# Patient Record
Sex: Female | Born: 1970 | Race: Black or African American | Hispanic: No | Marital: Married | Smoking: Former smoker
Health system: Southern US, Community
[De-identification: ages and names within clinical notes are randomized; demographics above are authoritative.]

## PROBLEM LIST (undated history)

## (undated) DIAGNOSIS — D869 Sarcoidosis, unspecified: Secondary | ICD-10-CM

## (undated) DIAGNOSIS — I639 Cerebral infarction, unspecified: Secondary | ICD-10-CM

## (undated) DIAGNOSIS — I10 Essential (primary) hypertension: Secondary | ICD-10-CM

## (undated) DIAGNOSIS — J449 Chronic obstructive pulmonary disease, unspecified: Secondary | ICD-10-CM

## (undated) DIAGNOSIS — N390 Urinary tract infection, site not specified: Secondary | ICD-10-CM

## (undated) HISTORY — PX: BRONCHOSCOPY: SUR163

---

## 2006-03-25 ENCOUNTER — Ambulatory Visit: Payer: Self-pay | Admitting: Neurology

## 2010-03-30 ENCOUNTER — Emergency Department: Payer: Self-pay | Admitting: Internal Medicine

## 2010-04-29 ENCOUNTER — Ambulatory Visit: Payer: Self-pay | Admitting: Internal Medicine

## 2010-09-12 ENCOUNTER — Ambulatory Visit: Payer: Self-pay

## 2013-04-02 ENCOUNTER — Ambulatory Visit: Payer: Self-pay | Admitting: Family Medicine

## 2013-04-02 LAB — URINALYSIS, COMPLETE
Glucose,UR: NEGATIVE mg/dL (ref 0–75)
RBC,UR: >30 /HPF (ref 0–5)
Specific Gravity: >=1.03 (ref 1.003–1.030)
WBC UR: >30 /HPF (ref 0–5)

## 2013-04-04 LAB — URINE CULTURE

## 2013-10-01 ENCOUNTER — Ambulatory Visit: Payer: Self-pay | Admitting: Family Medicine

## 2013-10-01 LAB — URINALYSIS, COMPLETE
Bilirubin,UR: NEGATIVE
Glucose,UR: NEGATIVE mg/dL (ref 0–75)
Ketone: NEGATIVE
NITRITE: POSITIVE
PH: 7 (ref 4.5–8.0)
Protein: 30
Specific Gravity: 1.015 (ref 1.003–1.030)

## 2013-10-04 LAB — URINE CULTURE

## 2014-03-25 ENCOUNTER — Ambulatory Visit: Payer: Self-pay | Admitting: Physician Assistant

## 2014-03-25 LAB — URINALYSIS, COMPLETE
Bilirubin,UR: NEGATIVE
GLUCOSE, UR: NEGATIVE mg/dL (ref 0–75)
NITRITE: NEGATIVE
Ph: 6 (ref 4.5–8.0)
Specific Gravity: >=1.03 (ref 1.003–1.030)

## 2014-03-26 LAB — URINE CULTURE

## 2015-02-21 ENCOUNTER — Ambulatory Visit
Admission: EM | Admit: 2015-02-21 | Discharge: 2015-02-21 | Disposition: A | Discharge status: Home / Self Care | Payer: BLUE CROSS/BLUE SHIELD | Attending: Family Medicine | Admitting: Family Medicine

## 2015-02-21 DIAGNOSIS — N3090 Cystitis, unspecified without hematuria: Secondary | ICD-10-CM | POA: Diagnosis not present

## 2015-02-21 DIAGNOSIS — B3741 Candidal cystitis and urethritis: Secondary | ICD-10-CM | POA: Diagnosis not present

## 2015-02-21 DIAGNOSIS — N390 Urinary tract infection, site not specified: Secondary | ICD-10-CM | POA: Insufficient documentation

## 2015-02-21 HISTORY — DX: Essential (primary) hypertension: I10

## 2015-02-21 HISTORY — DX: Urinary tract infection, site not specified: N39.0

## 2015-02-21 LAB — URINALYSIS COMPLETE WITH MICROSCOPIC (ARMC ONLY)
Bilirubin Urine: NEGATIVE
Glucose, UA: NEGATIVE mg/dL
Nitrite: NEGATIVE
PH: 5.5 (ref 5.0–8.0)
Protein, ur: NEGATIVE mg/dL
SPECIFIC GRAVITY, URINE: 1.03 (ref 1.005–1.030)

## 2015-02-21 MED ORDER — CIPROFLOXACIN HCL 500 MG PO TABS: 500.0000 mg | ORAL_TABLET | Freq: Two times a day (BID) | ORAL | Status: DC

## 2015-02-21 MED ORDER — FLUCONAZOLE 150 MG PO TABS: ORAL_TABLET | ORAL | Status: AC

## 2015-02-21 NOTE — ED Notes (Signed)
States started last night with urinary frequency and bladder fullness. Leaving for a cruise in the morning. C/o low back pain also

## 2015-02-21 NOTE — ED Provider Notes (Signed)
CSN: [id number]     Arrival date & time 02/21/15  1821 History   First MD Initiated Contact with Patient 02/21/15 1842     Chief Complaint  Patient presents with  . Cystitis   (Consider location/radiation/quality/duration/timing/severity/associated sxs/prior Treatment) HPI 44 yo F with previous hx of UTIs now felling full, noting frequency and worried because leaving for a cruise in the AM. Has had repeated UTIs . Followed by Urology -seen 6 months ago and doing well. Had some RBCs in past-monitored . Uses Cipro successfully in past. No back pain ,fever, nausea or vomting  Past Medical History  Diagnosis Date  . UTI (lower urinary tract infection)   . Hypertension    Past Surgical History  Procedure Laterality Date  . Cesarean section      2005   Family History  Problem Relation Age of Onset  . Heart failure Mother   . Cancer Father    History  Substance Use Topics  . Smoking status: Former Games developer  . Smokeless tobacco: Not on file  . Alcohol Use: Yes     Comment: socially   OB History    No data available     Review of Systems  Constitutional -afebrile Eyes-denies visual changes ENT- normal voice,denies sore throat CV-denies chest pain Resp-denies SOB GI- negative for nausea,vomiting, diarrhea GU- negative for dysuria-feels low abdominal pressure MSK- negative for back pain, ambulatory- no CVAT Skin- denies acute changes Neuro- negative headache,focal weakness or numbness   Allergies  Review of patient's allergies indicates no known allergies. Review of 10 systems negative for acute change except as referenced in HPI Home Medications   Prior to Admission medications   Medication Sig Start Date End Date Taking? Authorizing Provider  bisoprolol-hydrochlorothiazide (ZIAC) 2.5-6.25 MG per tablet Take 1 tablet by mouth daily.   Yes Historical Provider, MD  ciprofloxacin (CIPRO) 500 MG tablet Take 1 tablet (500 mg total) by mouth every 12 (twelve) hours. 02/21/15    Rae Halsted, PA-C  fluconazole (DIFLUCAN) 150 MG tablet Take one tablet now, and repeat in a week if needed 02/21/15   Rae Halsted, PA-C   BP 134/82 mmHg  Pulse 78  Temp(Src) 98.8 F (37.1 C) (Tympanic)  Resp 16  Ht  (1.753 m)  Wt 227 lb (102.967 kg)  BMI 33.51 kg/m2  SpO2 100%  LMP 01/18/2015 (Approximate) Physical Exam Constitutional -alert and oriented,well appearing and in no acute distress Head-atraumatic Eyes-  EOMI ,conjugate gaze Nose- no congestion  Mouth/throat- mucous membranes moist , Neck- supple without glandular enlargement CV- regular rate, grossly normal heart sounds,  Resp-no distress, normal respiratory effort,clear to auscultation bilaterally Back - no CVAT GI- soft,non-tender,no distention GU-/ not examined Musculo-, ambulatory Neuro- normal speech and language,  Skin-warm,dry ,intact; no rash noted Psych-mood and affect grossly normal; speech and behavior grossly normal ED Course  Procedures (including critical care time) Labs Review Labs Reviewed  URINALYSIS COMPLETEWITH MICROSCOPIC ([facility] ONLY) - Abnormal; Notable for the following:    APPearance HAZY (*)    Ketones, ur TRACE (*)    Hgb urine dipstick 2+ (*)    Leukocytes, UA TRACE (*)    Bacteria, UA MANY (*)    Squamous Epithelial / LPF 6-30 (*)    All other components within normal limits  URINE CULTURE    Imaging Review No results found.   MDM   1. Lower urinary tract infection   2. Yeast cystitis    Diflucan 150 mg now and repeat  in a week. Ciprofloxin 500 mg BID x 5 days- 1 refill. Call on return from trip to check culture results Rx would not populate  Plan: 1. Test/ results and diagnosis reviewed with patient 2. rx as per orders; risks, benefits, potential side effects reviewed with patient 3. Recommend supportive treatment with increased fluids by mouth, cranberry.blueberry 4. F/u prn if symptoms worsen or don't improve  Questions fielded, expectations and  recommendations reviewed. Patient expresses understanding. Will return to [facility] with questions, concern or exacerbation.   Rae Halsted, PA-C 02/21/15 2019

## 2015-02-23 LAB — URINE CULTURE

## 2015-05-31 ENCOUNTER — Encounter: Payer: Self-pay | Admitting: Emergency Medicine

## 2015-05-31 ENCOUNTER — Inpatient Hospital Stay
Admission: EM | Admit: 2015-05-31 | Discharge: 2015-06-01 | DRG: 060 | Disposition: A | Discharge status: Home / Self Care | Payer: BLUE CROSS/BLUE SHIELD | Attending: Internal Medicine | Admitting: Internal Medicine

## 2015-05-31 ENCOUNTER — Emergency Department: Payer: BLUE CROSS/BLUE SHIELD

## 2015-05-31 DIAGNOSIS — Z8673 Personal history of transient ischemic attack (TIA), and cerebral infarction without residual deficits: Secondary | ICD-10-CM | POA: Diagnosis not present

## 2015-05-31 DIAGNOSIS — Z79899 Other long term (current) drug therapy: Secondary | ICD-10-CM | POA: Diagnosis not present

## 2015-05-31 DIAGNOSIS — Z87891 Personal history of nicotine dependence: Secondary | ICD-10-CM | POA: Diagnosis not present

## 2015-05-31 DIAGNOSIS — I10 Essential (primary) hypertension: Secondary | ICD-10-CM | POA: Diagnosis present

## 2015-05-31 DIAGNOSIS — J449 Chronic obstructive pulmonary disease, unspecified: Secondary | ICD-10-CM | POA: Diagnosis present

## 2015-05-31 DIAGNOSIS — R51 Headache: Secondary | ICD-10-CM | POA: Diagnosis present

## 2015-05-31 DIAGNOSIS — R300 Dysuria: Secondary | ICD-10-CM | POA: Diagnosis present

## 2015-05-31 DIAGNOSIS — D869 Sarcoidosis, unspecified: Secondary | ICD-10-CM | POA: Diagnosis present

## 2015-05-31 DIAGNOSIS — G379 Demyelinating disease of central nervous system, unspecified: Principal | ICD-10-CM | POA: Diagnosis present

## 2015-05-31 DIAGNOSIS — I639 Cerebral infarction, unspecified: Secondary | ICD-10-CM | POA: Diagnosis present

## 2015-05-31 DIAGNOSIS — R208 Other disturbances of skin sensation: Secondary | ICD-10-CM

## 2015-05-31 DIAGNOSIS — R61 Generalized hyperhidrosis: Secondary | ICD-10-CM

## 2015-05-31 DIAGNOSIS — R200 Anesthesia of skin: Secondary | ICD-10-CM

## 2015-05-31 DIAGNOSIS — G459 Transient cerebral ischemic attack, unspecified: Secondary | ICD-10-CM

## 2015-05-31 DIAGNOSIS — R202 Paresthesia of skin: Secondary | ICD-10-CM

## 2015-05-31 HISTORY — DX: Chronic obstructive pulmonary disease, unspecified: J44.9

## 2015-05-31 HISTORY — DX: Cerebral infarction, unspecified: I63.9

## 2015-05-31 HISTORY — DX: Sarcoidosis, unspecified: D86.9

## 2015-05-31 LAB — URINALYSIS COMPLETE WITH MICROSCOPIC (ARMC ONLY)
Bilirubin Urine: NEGATIVE
Glucose, UA: NEGATIVE mg/dL
KETONES UR: NEGATIVE mg/dL
Nitrite: NEGATIVE
Protein, ur: NEGATIVE mg/dL
Specific Gravity, Urine: 1.015 (ref 1.005–1.030)
pH: 7 (ref 5.0–8.0)

## 2015-05-31 LAB — COMPREHENSIVE METABOLIC PANEL
ALBUMIN: 4.1 g/dL (ref 3.5–5.0)
ALT: 17 U/L (ref 14–54)
AST: 26 U/L (ref 15–41)
Alkaline Phosphatase: 71 U/L (ref 38–126)
Anion gap: 7 (ref 5–15)
BUN: 17 mg/dL (ref 6–20)
CO2: 28 mmol/L (ref 22–32)
Calcium: 9.1 mg/dL (ref 8.9–10.3)
Chloride: 103 mmol/L (ref 101–111)
Creatinine, Ser: 0.89 mg/dL (ref 0.44–1.00)
GFR calc Af Amer: >60 mL/min (ref >60)
GFR calc non Af Amer: >60 mL/min (ref >60)
Glucose, Bld: 90 mg/dL (ref 65–99)
Potassium: 3.5 mmol/L (ref 3.5–5.1)
Sodium: 138 mmol/L (ref 135–145)
Total Bilirubin: 0.7 mg/dL (ref 0.3–1.2)
Total Protein: 7.9 g/dL (ref 6.5–8.1)

## 2015-05-31 LAB — CBC
HCT: 38.5 % (ref 35.0–47.0)
Hemoglobin: 13 g/dL (ref 12.0–16.0)
MCH: 31.7 pg (ref 26.0–34.0)
MCHC: 33.7 g/dL (ref 32.0–36.0)
MCV: 94.1 fL (ref 80.0–100.0)
PLATELETS: 289 10*3/uL (ref 150–440)
RBC: 4.09 MIL/uL (ref 3.80–5.20)
RDW: 13.3 % (ref 11.5–14.5)
WBC: 7.9 10*3/uL (ref 3.6–11.0)

## 2015-05-31 LAB — POCT PREGNANCY, URINE: Preg Test, Ur: NEGATIVE

## 2015-05-31 LAB — TROPONIN I

## 2015-05-31 MED ORDER — ASPIRIN EC 325 MG PO TBEC
325.0000 mg | DELAYED_RELEASE_TABLET | Freq: Every day | ORAL | Status: DC
Start: 2015-06-01 — End: 2015-06-01
  Administered 2015-06-01 (×2): 325 mg via ORAL
  Filled 2015-05-31 (×2): qty 1

## 2015-05-31 NOTE — H&P (Addendum)
[facility] - [place] at [facility]   PATIENT NAME: Grace Olson    MR#:  [id number]  DATE OF BIRTH:  Oct 10, 1970  DATE OF ADMISSION:  05/31/2015  PRIMARY CARE PHYSICIAN: Berneice Gandy, MD   REQUESTING/REFERRING PHYSICIAN: Sharma Covert, MD  CHIEF COMPLAINT:   Chief Complaint  Patient presents with  . Excessive Sweating  . Numbness  . Shortness of Breath    HISTORY OF PRESENT ILLNESS:  Grace Olson  is a 44 y.o. female who presents with numbness of the left upper and lower extremities. Patient states that symptoms started today, and have persisted. She states that she's had episodes in the past where she had some amount of numbness for brief periods of time, but those resolved quickly. Symptoms today have not resolved. She also states that she had some shortness of breath earlier on in the day with an episode of diaphoresis. She has a history of sarcoidosis, which she states is well controlled. She has had an MRI proven stroke in the past per her report. Her symptoms with her prior stroke were focal lower extremity weakness, in conjunction with sensory deficit in the form of numbness. Her symptoms completely resolved after that stroke. Lab and imaging workup in the ED is initially benign. Patient does state that she has history of frequent UTIs, for which she sees urology. She states that she feels like she is developing UTI over the last couple of days. UA in the ED is not strongly suggestive for UTI, but does have a small amount of blood. Hospitalists were called for admission for workup of her numbness, with suspicion of TIA/CVA.  PAST MEDICAL HISTORY:   Past Medical History  Diagnosis Date  . UTI (lower urinary tract infection)   . Hypertension   . Sarcoid   . COPD (chronic obstructive pulmonary disease)   . Stroke     PAST SURGICAL HISTORY:   Past Surgical History  Procedure Laterality Date  . Cesarean section      2005  . Bronchoscopy      SOCIAL  HISTORY:   Social History  Substance Use Topics  . Smoking status: Former Games developer  . Smokeless tobacco: Never Used  . Alcohol Use: Yes     Comment: socially    FAMILY HISTORY:   Family History  Problem Relation Age of Onset  . Heart failure Mother   . Lung cancer Father   . Hypertension Mother   . Stroke Mother   . Hyperlipidemia Mother   . CAD Mother   . Hypertension Father   . Diabetes Sister   . CAD Maternal Grandmother   . Hypertension Maternal Grandmother   . Prostate cancer Maternal Grandfather   . Colon cancer Maternal Uncle     DRUG ALLERGIES:  No Known Allergies  MEDICATIONS AT HOME:   Prior to Admission medications   Medication Sig Start Date End Date Taking? Authorizing Provider  albuterol (PROVENTIL HFA;VENTOLIN HFA) 108 (90 BASE) MCG/ACT inhaler Inhale 2 puffs into the lungs every 6 (six) hours as needed for wheezing or shortness of breath.   Yes Historical Provider, MD  bisoprolol-hydrochlorothiazide (ZIAC) 2.5-6.25 MG per tablet Take 1 tablet by mouth daily.   Yes Historical Provider, MD  fluconazole (DIFLUCAN) 150 MG tablet Take one tablet now, and repeat in a week if needed Patient taking differently: Take 150 mg by mouth See admin instructions. Pt is to take one dose now and repeat in one week if necessary. 02/21/15  Yes Jacki Cones  Shonna Chock, PA-C  naproxen sodium (ANAPROX) 220 MG tablet Take 440 mg by mouth 2 (two) times daily as needed (for pain/headache).   Yes Historical Provider, MD  doxycycline (VIBRA-TABS) 100 MG tablet Take 100 mg by mouth 2 (two) times daily. 05/31/15 06/07/15  Historical Provider, MD    REVIEW OF SYSTEMS:  Review of Systems  Constitutional: Negative for fever, chills, weight loss and malaise/fatigue.  HENT: Negative for ear pain, hearing loss and tinnitus.   Eyes: Negative for blurred vision, double vision, pain and redness.  Respiratory: Positive for shortness of breath. Negative for cough and hemoptysis.   Cardiovascular: Negative for  chest pain, palpitations, orthopnea and leg swelling.  Gastrointestinal: Negative for nausea, vomiting, abdominal pain, diarrhea and constipation.  Genitourinary: Positive for dysuria. Negative for frequency and hematuria.  Musculoskeletal: Negative for back pain, joint pain and neck pain.  Skin:       No acne, rash, or lesions  Neurological: Positive for sensory change. Negative for dizziness, tremors, focal weakness and weakness.  Endo/Heme/Allergies: Negative for polydipsia. Does not bruise/bleed easily.  Psychiatric/Behavioral: Negative for depression. The patient is not nervous/anxious and does not have insomnia.      VITAL SIGNS:   Filed Vitals:   05/31/15 2010 05/31/15 2108 05/31/15 2225  BP: 146/85 116/71 139/81  Pulse: 65 59 64  Temp: 98.5 F (36.9 C)    TempSrc: Oral    Resp: Height:  (1.753 m)    Weight: 104.327 kg (230 lb)    SpO2: 100%  100%   Wt Readings from Last 3 Encounters:  05/31/15 104.327 kg (230 lb)  02/21/15 102.967 kg (227 lb)    PHYSICAL EXAMINATION:  Physical Exam  Vitals reviewed. Constitutional: She is oriented to person, place, and time. She appears well-developed and well-nourished. No distress.  HENT:  Head: Normocephalic and atraumatic.  Mouth/Throat: Oropharynx is clear and moist.  Eyes: Conjunctivae and EOM are normal. Pupils are equal, round, and reactive to light. No scleral icterus.  Neck: Normal range of motion. Neck supple. No JVD present. No thyromegaly present.  Cardiovascular: Normal rate, regular rhythm and intact distal pulses.  Exam reveals no gallop and no friction rub.   No murmur heard. Respiratory: Effort normal and breath sounds normal. No respiratory distress. She has no wheezes. She has no rales.  GI: Soft. Bowel sounds are normal. She exhibits no distension. There is no tenderness.  Musculoskeletal: Normal range of motion. She exhibits no edema.  No arthritis, no gout  Lymphadenopathy:    She has no  cervical adenopathy.  Neurological: She is alert and oriented to person, place, and time. No cranial nerve deficit.  Neurologic: Cranial nerves II-XII intact, Sensation intact to light touch/pinprick - the patient complains of tingling feeling in her distal left hand and distal left foot, 5/5 strength in all extremities, no dysarthria, no aphasia, no dysphagia, memory intact, finger to nose testing showed no abnormality, no pronator drift, DTR intact, Babinski sign not present.   Skin: Skin is warm and dry. No rash noted. No erythema.  Psychiatric: She has a normal mood and affect. Her behavior is normal. Judgment and thought content normal.    LABORATORY PANEL:   CBC  Recent Labs Lab 05/31/15 2024  WBC 7.9  HGB 13.0  HCT 38.5  PLT 289   ------------------------------------------------------------------------------------------------------------------  Chemistries   Recent Labs Lab 05/31/15 2024  NA 138  K 3.5  CL 103  CO2 28  GLUCOSE 90  BUN 17  CREATININE 0.89  CALCIUM 9.1  AST 26  ALT 17  ALKPHOS 71  BILITOT 0.7   ------------------------------------------------------------------------------------------------------------------  Cardiac Enzymes  Recent Labs Lab 05/31/15 2024  TROPONINI <0.03   ------------------------------------------------------------------------------------------------------------------  RADIOLOGY:  Dg Chest 2 View  05/31/2015   CLINICAL DATA:  Numbness. Left arm and leg tingling for 9 hours. Shortness of breath.  EXAM: CHEST  2 VIEW  COMPARISON:  09/12/2010  FINDINGS: The cardiomediastinal contours are normal. Minimal linear atelectasis or scarring in the left upper lung zone. Pulmonary vasculature is normal. No consolidation, pleural effusion, or pneumothorax. No acute osseous abnormalities are seen.  IMPRESSION: No acute pulmonary process.   Electronically Signed   By: Rubye Oaks M.D.   On: 05/31/2015 21:40   Ct Head Wo  Contrast  05/31/2015   CLINICAL DATA:  Left arm numbness and tingling  EXAM: CT HEAD WITHOUT CONTRAST  TECHNIQUE: Contiguous axial images were obtained from the base of the skull through the vertex without intravenous contrast.  COMPARISON:  None.  FINDINGS: No skull fracture is noted. Paranasal sinuses and mastoid air cells are unremarkable. No intracranial hemorrhage, mass effect or midline shift. No acute cortical infarction. No mass lesion is noted on this unenhanced scan. No hydrocephalus. No intra or extra-axial fluid collection.  IMPRESSION: No acute intracranial abnormality.   Electronically Signed   By: Natasha Mead M.D.   On: 05/31/2015 20:46    EKG:   Orders placed or performed during the hospital encounter of 05/31/15  . ED EKG within 10 minutes  . ED EKG within 10 minutes  . EKG 12-Lead  . EKG 12-Lead    IMPRESSION AND PLAN:  Principal Problem:   CVA (cerebral infarction) - admitting the patient on telemetry, order MRI and MRA of her brain as well as MRA of her neck. We'll get a fasting lipid panel, hemoglobin A1c, trend her troponins, give her full strength aspirin, get echocardiogram, and order neurology consult. We will also check her vitamin B12 and vitamin D levels. Active Problems:   HTN (hypertension) - hold home antihypertensives for first 24 hours to allow for permissive hypertension BP goal of less than 220/110.   H/O: stroke - patient states this is MRI proven in the past with some overlap her current symptoms when she had her initial stroke. However, for unknown reasons she was not on daily aspirin.   Frequent UTI  - patient has a history of frequent UTIs, UA not strongly suspicious for recurrent UTI, the patient is having clinical symptoms. We will send a urine culture and follow up and initiate antibiotics if she has any positive growth.    COPD (chronic obstructive pulmonary disease) - continue home inhaler when necessary   Sarcoid - patient states this is currently  under control, chest x-ray was clear and it seems less likely this is contributing to her problem, we'll monitor this for now. Calcium is normal, we'll check a vitamin D.  All the records are reviewed and case discussed with ED provider. Management plans discussed with the patient and/or family.  DVT PROPHYLAXIS: SubQ lovenox  ADMISSION STATUS: Inpatient  CODE STATUS: Full  TOTAL TIME TAKING CARE OF THIS PATIENT: 45 minutes.    WILLIS, DAVID FIELDING 05/31/2015, 11:41 PM  [facility]  Office  [phone]  CC: Primary care physician; Berneice Gandy, MD

## 2015-05-31 NOTE — ED Notes (Signed)
MD at bedside. 

## 2015-05-31 NOTE — ED Notes (Signed)
Pt states began to have left arm tingling and left leg tingling at noon. Pt states began to experience sweating and shob at same time. Pt denies nausea, vomiting, denies chest pain. Pt states now has posterior headaceh.

## 2015-05-31 NOTE — ED Provider Notes (Signed)
[facility] Emergency Department Provider Note  ____________________________________________  Time seen: Approximately 9:23 PM  I have reviewed the triage vital signs and the nursing notes.   HISTORY  Chief Complaint Excessive Sweating; Numbness; and Shortness of Breath    HPI Grace Olson is a 44 y.o. female with a history of sarcoidosis, HTN, presenting with diaphoresis, left upper extremity and left foot numbness. Patient discovered that she was sitting at her desk when she developed "hot flashes"associated with left upper extremity numbness and tingling and left foot numbness and tingling. She also had a mild headache in the crown of her head. She denies any visual changes, speech changes, difficulty walking, weakness. She did not have any chest pain, shortness of breath, nausea or vomiting. She has not had any recent trauma, changes in her medications, or other illness.   Past Medical History  Diagnosis Date  . UTI (lower urinary tract infection)   . Hypertension   . Sarcoid   . HTN (hypertension)   . COPD (chronic obstructive pulmonary disease)   . Stroke    sarcoid  There are no active problems to display for this patient.   Past Surgical History  Procedure Laterality Date  . Cesarean section      2005  . Bronchoscopy      Current Outpatient Rx  Name  Route  Sig  Dispense  Refill  . albuterol (PROVENTIL HFA;VENTOLIN HFA) 108 (90 BASE) MCG/ACT inhaler   Inhalation   Inhale 2 puffs into the lungs every 6 (six) hours as needed for wheezing or shortness of breath.         . bisoprolol-hydrochlorothiazide (ZIAC) 2.5-6.25 MG per tablet   Oral   Take 1 tablet by mouth daily.         . fluconazole (DIFLUCAN) 150 MG tablet      Take one tablet now, and repeat in a week if needed Patient taking differently: Take 150 mg by mouth See admin instructions. Pt is to take one dose now and repeat in one week if necessary.   2 tablet   0   .  naproxen sodium (ANAPROX) 220 MG tablet   Oral   Take 440 mg by mouth 2 (two) times daily as needed (for pain/headache).         . ciprofloxacin (CIPRO) 500 MG tablet   Oral   Take 1 tablet (500 mg total) by mouth every 12 (twelve) hours. Patient not taking: Reported on 05/31/2015   10 tablet   1   . doxycycline (VIBRA-TABS) 100 MG tablet   Oral   Take 100 mg by mouth 2 (two) times daily.           Allergies Review of patient's allergies indicates no known allergies.  Family History  Problem Relation Age of Onset  . Heart failure Mother   . Lung cancer Father   . Hypertension Mother   . Stroke Mother   . Hyperlipidemia Mother   . CAD Mother   . Hypertension Father   . Diabetes Sister   . CAD Maternal Grandmother   . Hypertension Maternal Grandmother   . Prostate cancer Maternal Grandfather   . Colon cancer Maternal Uncle    Mother and grandmother with history of CHF, MI, and stroke.  Social History Social History  Substance Use Topics  . Smoking status: Former Games developer  . Smokeless tobacco: Never Used  . Alcohol Use: Yes     Comment: socially    Review  of Systems Constitutional: No fever/chills Eyes: No visual changes. ENT: No sore throat. Cardiovascular: Denies chest pain, palpitations. Respiratory: Denies shortness of breath.  No cough. Gastrointestinal: No abdominal pain.  No nausea, no vomiting.  No diarrhea.  No constipation. Genitourinary: Negative for dysuria. Musculoskeletal: Negative for back pain. Skin: Negative for rash. Neurological: Negative for headaches, positive for numbness and tingling. Positive for headache  10-point ROS otherwise negative.  ____________________________________________   PHYSICAL EXAM:  VITAL SIGNS: ED Triage Vitals  Enc Vitals Group     BP 05/31/15 2010 146/85 mmHg     Pulse Rate 05/31/15 2010 65     Resp 05/31/15 2010 20     Temp 05/31/15 2010 98.5 F (36.9 C)     Temp Source 05/31/15 2010 Oral     SpO2  05/31/15 2010 100 %     Weight 05/31/15 2010 230 lb (104.327 kg)     Height 05/31/15 2010  (1.753 m)     Head Cir --      Peak Flow --      Pain Score --      Pain Loc --      Pain Edu? --      Excl. in GC? --     Constitutional: Alert and oriented. Well appearing and in no acute distress. Answer question appropriately. Eyes: Conjunctivae are normal.  EOMI. Head: Atraumatic. Nose: No congestion/rhinnorhea. Mouth/Throat: Mucous membranes are moist.  Neck: No stridor.  Supple.   Cardiovascular: Normal rate, regular rhythm. No murmurs, rubs or gallops.  Respiratory: Normal respiratory effort.  No retractions. Lungs CTAB.  No wheezes, rales or ronchi. Gastrointestinal: Soft and nontender. No distention. No peritoneal signs. Musculoskeletal: No LE edema.  Neurologic:  Alert and oriented 3. Speech is clear. Face and smile are symmetric. Extraocular movements are intact. PERRLA. No pronator drift. 5 out of 5 bilateral grip, biceps, triceps, hip flexors, but flexion and extension. Normal sensation to light touch on the face. Decreased sensation to light touch in the left upper extremity and left lower extremity. Normal finger-nose-finger.  Skin:  Skin is warm, dry and intact. No rash noted. Psychiatric: Mood and affect are normal. Speech and behavior are normal.  Normal judgement.  ____________________________________________   LABS (all labs ordered are listed, but only abnormal results are displayed)  Labs Reviewed  URINALYSIS COMPLETEWITH MICROSCOPIC ([facility] ONLY) - Abnormal; Notable for the following:    Color, Urine YELLOW (*)    APPearance CLEAR (*)    Hgb urine dipstick 2+ (*)    Leukocytes, UA TRACE (*)    Bacteria, UA MANY (*)    Squamous Epithelial / LPF 0-5 (*)    All other components within normal limits  CBC  TROPONIN I  COMPREHENSIVE METABOLIC PANEL  POCT PREGNANCY, URINE   ____________________________________________  EKG  ED ECG REPORT I, Rockne Menghini, the attending physician, personally viewed and interpreted this ECG.   Date: 05/31/2015  Rhythm: Normal sinus rhythm  Axis: Normal axis   Intervals:none  ST&T Change: No ST-T changes.  ____________________________________________  RADIOLOGY  Dg Chest 2 View  05/31/2015   CLINICAL DATA:  Numbness. Left arm and leg tingling for 9 hours. Shortness of breath.  EXAM: CHEST  2 VIEW  COMPARISON:  09/12/2010  FINDINGS: The cardiomediastinal contours are normal. Minimal linear atelectasis or scarring in the left upper lung zone. Pulmonary vasculature is normal. No consolidation, pleural effusion, or pneumothorax. No acute osseous abnormalities are seen.  IMPRESSION: No acute pulmonary process.  Electronically Signed   By: Rubye Oaks M.D.   On: 05/31/2015 21:40   Ct Head Wo Contrast  05/31/2015   CLINICAL DATA:  Left arm numbness and tingling  EXAM: CT HEAD WITHOUT CONTRAST  TECHNIQUE: Contiguous axial images were obtained from the base of the skull through the vertex without intravenous contrast.  COMPARISON:  None.  FINDINGS: No skull fracture is noted. Paranasal sinuses and mastoid air cells are unremarkable. No intracranial hemorrhage, mass effect or midline shift. No acute cortical infarction. No mass lesion is noted on this unenhanced scan. No hydrocephalus. No intra or extra-axial fluid collection.  IMPRESSION: No acute intracranial abnormality.   Electronically Signed   By: Natasha Mead M.D.   On: 05/31/2015 20:46    ____________________________________________   PROCEDURES  Procedure(s) performed: None  Critical Care performed: No ____________________________________________   INITIAL IMPRESSION / ASSESSMENT AND PLAN / ED COURSE  Pertinent labs & imaging results that were available during my care of the patient were reviewed by me and considered in my medical decision making (see chart for details).  44 y.o. female with a history of sarcoidosis and hypertension  presenting with acute onset of diaphoresis left upper extremity numbness and left foot numbness. She continues to have these sensory deficits. She is young with few risk factors, but will need evaluation for acute stroke. I would also be concerned about acute flare of sarcoid, consider new diagnosis of multiple sclerosis as the patient states that she has had episodes of sensory deficits in the distant past. Plan for admission.  ____________________________________________  FINAL CLINICAL IMPRESSION(S) / ED DIAGNOSES  Final diagnoses:  Diaphoresis  Numbness and tingling in left arm  Numbness of left foot      NEW MEDICATIONS STARTED DURING THIS VISIT:  New Prescriptions   No medications on file     Rockne Menghini, MD 05/31/15 2312

## 2015-06-01 ENCOUNTER — Inpatient Hospital Stay
Admit: 2015-06-01 | Discharge: 2015-06-01 | Disposition: A | Discharge status: Home / Self Care | Payer: BLUE CROSS/BLUE SHIELD | Attending: Internal Medicine | Admitting: Internal Medicine

## 2015-06-01 ENCOUNTER — Inpatient Hospital Stay: Payer: BLUE CROSS/BLUE SHIELD

## 2015-06-01 LAB — CBC
HCT: 35.6 % (ref 35.0–47.0)
HEMOGLOBIN: 12.3 g/dL (ref 12.0–16.0)
MCH: 32.3 pg (ref 26.0–34.0)
MCHC: 34.5 g/dL (ref 32.0–36.0)
MCV: 93.5 fL (ref 80.0–100.0)
PLATELETS: 235 10*3/uL (ref 150–440)
RBC: 3.81 MIL/uL (ref 3.80–5.20)
RDW: 13.4 % (ref 11.5–14.5)
WBC: 8.8 10*3/uL (ref 3.6–11.0)

## 2015-06-01 LAB — LIPID PANEL
CHOL/HDL RATIO: 2.8 ratio
CHOLESTEROL: 152 mg/dL (ref 0–200)
HDL: 55 mg/dL (ref >40)
LDL Cholesterol: 92 mg/dL (ref 0–99)
Triglycerides: 26 mg/dL (ref <150)
VLDL: 5 mg/dL (ref 0–40)

## 2015-06-01 LAB — BASIC METABOLIC PANEL
ANION GAP: 6 (ref 5–15)
BUN: 13 mg/dL (ref 6–20)
CHLORIDE: 105 mmol/L (ref 101–111)
CO2: 27 mmol/L (ref 22–32)
CREATININE: 0.72 mg/dL (ref 0.44–1.00)
Calcium: 8.5 mg/dL — ABNORMAL LOW (ref 8.9–10.3)
GFR calc non Af Amer: >60 mL/min (ref >60)
Glucose, Bld: 91 mg/dL (ref 65–99)
POTASSIUM: 3.5 mmol/L (ref 3.5–5.1)
SODIUM: 138 mmol/L (ref 135–145)

## 2015-06-01 LAB — TROPONIN I: Troponin I: <0.03 ng/mL (ref <0.031)

## 2015-06-01 MED ORDER — STROKE: EARLY STAGES OF RECOVERY BOOK
Freq: Once | Status: AC
  Administered 2015-06-01: 02:00:00

## 2015-06-01 MED ORDER — ONDANSETRON HCL 4 MG PO TABS: 4.0000 mg | ORAL_TABLET | Freq: Four times a day (QID) | ORAL | Status: DC | PRN

## 2015-06-01 MED ORDER — SODIUM CHLORIDE 0.9 % IJ SOLN: 3.0000 mL | Freq: Two times a day (BID) | INTRAMUSCULAR | Status: DC

## 2015-06-01 MED ORDER — GADOBENATE DIMEGLUMINE 529 MG/ML IV SOLN
20.0000 mL | Freq: Once | INTRAVENOUS | Status: AC | PRN
  Administered 2015-06-01: 11:00:00 20 mL via INTRAVENOUS

## 2015-06-01 MED ORDER — ALBUTEROL SULFATE (2.5 MG/3ML) 0.083% IN NEBU: 3.0000 mL | INHALATION_SOLUTION | Freq: Four times a day (QID) | RESPIRATORY_TRACT | Status: DC | PRN

## 2015-06-01 MED ORDER — ENOXAPARIN SODIUM 40 MG/0.4ML ~~LOC~~ SOLN
40.0000 mg | Freq: Every day | SUBCUTANEOUS | Status: DC
  Administered 2015-06-01: 40 mg via SUBCUTANEOUS
  Filled 2015-06-01: qty 0.4

## 2015-06-01 MED ORDER — ASPIRIN 81 MG PO TABS: 81.0000 mg | ORAL_TABLET | Freq: Every day | ORAL | Status: AC

## 2015-06-01 MED ORDER — SODIUM CHLORIDE 0.9 % IV SOLN
INTRAVENOUS | Status: AC
  Administered 2015-06-01: 02:00:00 via INTRAVENOUS

## 2015-06-01 MED ORDER — ACETAMINOPHEN 650 MG RE SUPP: 650.0000 mg | Freq: Four times a day (QID) | RECTAL | Status: DC | PRN

## 2015-06-01 MED ORDER — ONDANSETRON HCL 4 MG/2ML IJ SOLN
4.0000 mg | Freq: Four times a day (QID) | INTRAMUSCULAR | Status: DC | PRN
Start: 2015-06-01 — End: 2015-06-01

## 2015-06-01 MED ORDER — ACETAMINOPHEN 325 MG PO TABS: 650.0000 mg | ORAL_TABLET | Freq: Four times a day (QID) | ORAL | Status: DC | PRN

## 2015-06-01 NOTE — Discharge Instructions (Signed)
Please call Monday to make a follow up appointment with Dr. Ether Griffins.  She will need to order an MRI of the brain with contrast.   DIET:  Regular diet  DISCHARGE CONDITION:  Stable  ACTIVITY:  Activity as tolerated  OXYGEN:  Home Oxygen: No.   Oxygen Delivery: room air  DISCHARGE LOCATION:  home   If you experience worsening of your admission symptoms, develop shortness of breath, life threatening emergency, suicidal or homicidal thoughts you must seek medical attention immediately by calling 911 or calling your MD immediately  if symptoms less severe.  You Must read complete instructions/literature along with all the possible adverse reactions/side effects for all the Medicines you take and that have been prescribed to you. Take any new Medicines after you have completely understood and accpet all the possible adverse reactions/side effects.   Please note  You were cared for by a hospitalist during your hospital stay. If you have any questions about your discharge medications or the care you received while you were in the hospital after you are discharged, you can call the unit and asked to speak with the hospitalist on call if the hospitalist that took care of you is not available. Once you are discharged, your primary care physician will handle any further medical issues. Please note that NO REFILLS for any discharge medications will be authorized once you are discharged, as it is imperative that you return to your primary care physician (or establish a relationship with a primary care physician if you do not have one) for your aftercare needs so that they can reassess your need for medications and monitor your lab values.

## 2015-06-01 NOTE — Progress Notes (Signed)
Pt being discharged home at this time, discharge reviewed with pt, states understanding, pt with no noted complaints at discharge

## 2015-06-01 NOTE — Evaluation (Signed)
Physical Therapy Evaluation Patient Details Name: DENIESHA STENGLEIN MRN: [id number] DOB: [birth year] Today's Date: 06/01/2015   History of Present Illness  Pt with some L U&LEs numbness  Clinical Impression  Pt with no weakness or functional limitations. She is still having some L finger numbness along with some L heel and dorsal foot numbness but again there is no functional limitations. No visual, coordination or other CVA like symptoms.  Pt does well and does not need further PT intervention.     Follow Up Recommendations No PT follow up    Equipment Recommendations       Recommendations for Other Services       Precautions / Restrictions Precautions Precautions: None Restrictions Weight Bearing Restrictions: No      Mobility  Bed Mobility Overal bed mobility: Independent                Transfers Overall transfer level: Independent                  Ambulation/Gait Ambulation/Gait assistance: Independent Ambulation Distance (Feet): 250 Feet Assistive device: None       General Gait Details: Pt walks with confidence and good speed.  She has no LOBs and reports being at her baseline.    Stairs Stairs: Yes Stairs assistance: Independent Stair Management: No rails Number of Stairs: 12 General stair comments: negotiates up/down steps with good confidence and no safety issues  Wheelchair Mobility    Modified Rankin (Stroke Patients Only)       Balance Overall balance assessment: Independent                                           Pertinent Vitals/Pain Pain Assessment: No/denies pain    Home Living Family/patient expects to be discharged to:: Private residence Living Arrangements: Spouse/significant other;Children   Type of Home: House Home Access: Stairs to enter     Home Layout: Two level        Prior Function Level of Independence: Independent (goes to the gym weekly)         Comments: pt completely  independent and has no functional limitations     Hand Dominance        Extremity/Trunk Assessment   Upper Extremity Assessment: Overall WFL for tasks assessed (equal bilaterally)           Lower Extremity Assessment: Overall WFL for tasks assessed (equal bilaterally)         Communication   Communication: No difficulties  Cognition Arousal/Alertness: Awake/alert Behavior During Therapy: WFL for tasks assessed/performed Overall Cognitive Status: Within Functional Limits for tasks assessed                      General Comments      Exercises        Assessment/Plan    PT Assessment Patent does not need any further PT services  PT Diagnosis Difficulty walking   PT Problem List    PT Treatment Interventions     PT Goals (Current goals can be found in the Care Plan section) Acute Rehab PT Goals Patient Stated Goal: I just wanted to make sure everything was okay    Frequency     Barriers to discharge        Co-evaluation               End  of Session Equipment Utilized During Treatment: Gait belt Activity Tolerance: Patient tolerated treatment well Patient left: in bed           Time: 0756-0810 PT Time Calculation (min) (ACUTE ONLY): 14 min   Charges:   PT Evaluation $Initial PT Evaluation Tier I: 1 Procedure     PT G Codes:       Loran Senters, PT, DPT [phone]  Malachi Pro 06/01/2015, 10:17 AM

## 2015-06-01 NOTE — Progress Notes (Signed)
SLP Note  Patient Details Name: Grace Olson MRN: [id number] DOB: March 12, 1971   Received order for speech therapy evaluation. Reviewed chart and spoke w/nsg and pt. Pt was admitted for UE and LE tingling. Pt is currently on a regular diet w/thin liquids which ST observed her consuming upon entering room w/out any noted difficulties. Pt conversed easily and appropriately w/ST and reports that she has had no difficulty swallowing and that speech is at baseline. No skilled ST indicated at this time. ST is available for re-consult if indicated or further concerns arise. Nsg and pt in agreement.         [place],Maaran 06/01/2015, 12:47 PM

## 2015-06-01 NOTE — Evaluation (Signed)
Occupational Therapy Evaluation Patient Details Name: WYNNIE PACETTI MRN: [id number] DOB: 23-Feb-1971 Today's Date: 06/01/2015    History of Present Illness Patient reports she was at work yesterday and started to feel clammy, heart racing, labored breathing and left hand and foot tingling.  She reports feeling better but still has some left sided tingling and numb feelings.   Clinical Impression   Patient is a 44 yo female who was admitted to [facility] yesterday after episode of left sided numbness and tingling.  She lives at home with her husband, has been independent and works full time.  She was evaluated by OT and has no current OT needs.  Strength, ROM, sensation and coordination are all within normal limits.  She is independent with self care tasks.  No further OT indicated.     Follow Up Recommendations  No OT follow up    Equipment Recommendations       Recommendations for Other Services       Precautions / Restrictions Precautions Precautions: None Restrictions Weight Bearing Restrictions: No      Mobility Bed Mobility Overal bed mobility: Independent                Transfers Overall transfer level: Independent                    Balance Overall balance assessment: Independent                                          ADL Overall ADL's : Independent                                             Vision     Perception     Praxis      Pertinent Vitals/Pain Pain Assessment: No/denies pain     Hand Dominance Right   Extremity/Trunk Assessment Upper Extremity Assessment Upper Extremity Assessment: Overall WFL for tasks assessed   Lower Extremity Assessment Lower Extremity Assessment: Overall WFL for tasks assessed       Communication Communication Communication: No difficulties   Cognition Arousal/Alertness: Awake/alert Behavior During Therapy: WFL for tasks assessed/performed Overall Cognitive  Status: Within Functional Limits for tasks assessed                     General Comments       Exercises   Other Exercises Other Exercises: grip strength right 95#, left 103#.  Pinch skills lateral R 22#, left 20#.  3 point pinch right 25#, left 22#, 2 point pinch right 18#, left 16 #.  9 hole peg test right 15 secs, left 17 secs.  stereognosis intact.     Shoulder Instructions      Home Living Family/patient expects to be discharged to:: Private residence Living Arrangements: Spouse/significant other;Children   Type of Home: House Home Access: Stairs to enter     Home Layout: Two level     Bathroom Shower/Tub: Walk-in shower;Door   [employer] Toilet: Standard Bathroom Accessibility: Yes   Home Equipment: None          Prior Functioning/Environment Level of Independence: Independent        Comments: pt completely independent and has no functional limitations    OT Diagnosis:  OT Problem List:     OT Treatment/Interventions:      OT Goals(Current goals can be found in the care plan section) Acute Rehab OT Goals Patient Stated Goal: Return home to independent status OT Goal Formulation: With patient Potential to Achieve Goals: Good  OT Frequency:     Barriers to D/C:            Co-evaluation              End of Session    Activity Tolerance:   Patient left:     Time: 1610-9604 OT Time Calculation (min): 24 min Charges:  OT General Charges $OT Visit: 1 Procedure OT Evaluation $Initial OT Evaluation Tier I: 1 Procedure G-Codes:    Lovett,Amy 06-03-15, 10:28 AM

## 2015-06-01 NOTE — Progress Notes (Signed)
[facility] [place] at [facility]   PATIENT NAME: Grace Olson    MR#:  [id number]  DATE OF BIRTH:  [birth year]  SUBJECTIVE:  CHIEF COMPLAINT:   Chief Complaint  Patient presents with  . Excessive Sweating  . Numbness  . Shortness of Breath   Persistent numbness and tingling over the left fingertips and palm as well as the left heel and anterior lower leg. Otherwise no new symptoms.  REVIEW OF SYSTEMS:   Review of Systems  Constitutional: Negative for fever.  Respiratory: Negative for shortness of breath.   Cardiovascular: Negative for chest pain and palpitations.  Gastrointestinal: Negative for nausea, vomiting and abdominal pain.  Genitourinary: Negative for dysuria.  Neurological: Positive for tingling and sensory change. Negative for speech change and focal weakness.    DRUG ALLERGIES:  No Known Allergies  VITALS:  Blood pressure 118/75, pulse 54, temperature 98.3 F (36.8 C), temperature source Oral, resp. rate 20, height  (1.753 m), weight 103.874 kg (229 lb), last menstrual period 05/14/2015, SpO2 98 %.  PHYSICAL EXAMINATION:  GENERAL:  44 y.o.-year-old patient lying in the bed with no acute distress.  EYES: Pupils equal, round, reactive to light and accommodation. No scleral icterus. Extraocular muscles intact.  HEENT: Head atraumatic, normocephalic. Oropharynx and nasopharynx clear.  NECK:  Supple, no jugular venous distention. No thyroid enlargement, no tenderness.  LUNGS: Normal breath sounds bilaterally, no wheezing, rales,rhonchi or crepitation. No use of accessory muscles of respiration.  CARDIOVASCULAR: S1, S2 normal. No murmurs, rubs, or gallops.  ABDOMEN: Soft, nontender, nondistended. Bowel sounds present. No organomegaly or mass.  EXTREMITIES: No pedal edema, cyanosis, or clubbing.  NEUROLOGIC: Cranial nerves II through XII are intact. Muscle strength 5/5 in all extremities. Sensation intact to fine touch but she does have  paresthesia over the left hand and left foot. Gait not checked.  PSYCHIATRIC: The patient is alert and oriented x 3.  SKIN: No obvious rash, lesion, or ulcer.    LABORATORY PANEL:   CBC  Recent Labs Lab 06/01/15 0211  WBC 8.8  HGB 12.3  HCT 35.6  PLT 235   ------------------------------------------------------------------------------------------------------------------  Chemistries   Recent Labs Lab 05/31/15 2024 06/01/15 0211  NA 138 138  K 3.5 3.5  CL 103 105  CO2 28 27  GLUCOSE 90 91  BUN 17 13  CREATININE 0.89 0.72  CALCIUM 9.1 8.5*  AST 26  --   ALT 17  --   ALKPHOS 71  --   BILITOT 0.7  --    ------------------------------------------------------------------------------------------------------------------  Cardiac Enzymes  Recent Labs Lab 06/01/15 0626  TROPONINI <0.03   ------------------------------------------------------------------------------------------------------------------  RADIOLOGY:  Dg Chest 2 View  05/31/2015   CLINICAL DATA:  Numbness. Left arm and leg tingling for 9 hours. Shortness of breath.  EXAM: CHEST  2 VIEW  COMPARISON:  09/12/2010  FINDINGS: The cardiomediastinal contours are normal. Minimal linear atelectasis or scarring in the left upper lung zone. Pulmonary vasculature is normal. No consolidation, pleural effusion, or pneumothorax. No acute osseous abnormalities are seen.  IMPRESSION: No acute pulmonary process.   Electronically Signed   By: Rubye Oaks M.D.   On: 05/31/2015 21:40   Ct Head Wo Contrast  05/31/2015   CLINICAL DATA:  Left arm numbness and tingling  EXAM: CT HEAD WITHOUT CONTRAST  TECHNIQUE: Contiguous axial images were obtained from the base of the skull through the vertex without intravenous contrast.  COMPARISON:  None.  FINDINGS: No skull fracture is noted. Paranasal  sinuses and mastoid air cells are unremarkable. No intracranial hemorrhage, mass effect or midline shift. No acute cortical infarction. No  mass lesion is noted on this unenhanced scan. No hydrocephalus. No intra or extra-axial fluid collection.  IMPRESSION: No acute intracranial abnormality.   Electronically Signed   By: Natasha Mead M.D.   On: 05/31/2015 20:46   Mr Angiogram Neck W Wo Contrast  06/01/2015   CLINICAL DATA:  Hypertension. Episode of diaphoresis and palpitations. Personal history of previous stroke.  EXAM: MRI HEAD WITHOUT AND WITH CONTRAST  MRA HEAD WITHOUT CONTRAST  MRA NECK WITHOUT AND WITH CONTRAST  TECHNIQUE: Multiplanar, multiecho pulse sequences of the brain and surrounding structures were obtained without and with intravenous contrast. Angiographic images of the Circle of Willis were obtained using MRA technique without intravenous contrast. Angiographic images of the neck were obtained using MRA technique without and with intravenous contrast. Carotid stenosis measurements (when applicable) are obtained utilizing NASCET criteria, using the distal internal carotid diameter as the denominator.  CONTRAST:  20mL MULTIHANCE GADOBENATE DIMEGLUMINE 529 MG/ML IV SOLN  COMPARISON:  None.  FINDINGS: MRI HEAD FINDINGS  Diffusion imaging does not show any acute or subacute infarction. The brainstem and cerebellum are normal. Within the cerebral hemispheres, there are scattered foci of T2 and FLAIR signal consistent with old small vessel infarctions. The differential diagnosis is that of demyelinating disease. No cortical or large vessel territory infarction. No mass lesion, hemorrhage, hydrocephalus or extra-axial collection. No pituitary mass. No inflammatory sinus disease. No skull or skullbase lesion.  MRA HEAD FINDINGS  Both internal carotid arteries are widely patent into the brain. No siphon stenosis. The anterior and middle cerebral vessels are normal without proximal stenosis, aneurysm or vascular malformation. Both vertebral arteries are widely patent to the basilar. No basilar stenosis. Posterior circulation branch vessels are  normal.  MRA NECK FINDINGS  Branching pattern of the brachiocephalic vessels from the arch is normal. No origin stenosis. Both common carotid arteries are widely patent to the bifurcation. Both carotid bifurcations are normal without stenosis or irregularity. Both cervical internal carotid arteries are normal. Both vertebral arteries are widely patent at their origins and through the cervical region.  IMPRESSION: Normal MR angiography of the neck vessels.  Normal intracranial MR angiography of the large and medium size vessels.  No acute brain finding. 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 Differential diagnosis does include demyelinating disease, but that is felt considerably less likely.   Electronically Signed   By: Paulina Fusi M.D.   On: 06/01/2015 11:53   Mr Brain Wo Contrast  06/01/2015   CLINICAL DATA:  Hypertension. Episode of diaphoresis and palpitations. Personal history of previous stroke.  EXAM: MRI HEAD WITHOUT AND WITH CONTRAST  MRA HEAD WITHOUT CONTRAST  MRA NECK WITHOUT AND WITH CONTRAST  TECHNIQUE: Multiplanar, multiecho pulse sequences of the brain and surrounding structures were obtained without and with intravenous contrast. Angiographic images of the Circle of Willis were obtained using MRA technique without intravenous contrast. Angiographic images of the neck were obtained using MRA technique without and with intravenous contrast. Carotid stenosis measurements (when applicable) are obtained utilizing NASCET criteria, using the distal internal carotid diameter as the denominator.  CONTRAST:  20mL MULTIHANCE GADOBENATE DIMEGLUMINE 529 MG/ML IV SOLN  COMPARISON:  None.  FINDINGS: MRI HEAD FINDINGS  Diffusion imaging does not show any acute or subacute infarction. The brainstem and cerebellum are normal.  Within the cerebral hemispheres, there are scattered foci of T2 and FLAIR signal consistent  with old small vessel infarctions. The differential diagnosis is that of demyelinating disease. No cortical or large vessel territory infarction. No mass lesion, hemorrhage, hydrocephalus or extra-axial collection. No pituitary mass. No inflammatory sinus disease. No skull or skullbase lesion.  MRA HEAD FINDINGS  Both internal carotid arteries are widely patent into the brain. No siphon stenosis. The anterior and middle cerebral vessels are normal without proximal stenosis, aneurysm or vascular malformation. Both vertebral arteries are widely patent to the basilar. No basilar stenosis. Posterior circulation branch vessels are normal.  MRA NECK FINDINGS  Branching pattern of the brachiocephalic vessels from the arch is normal. No origin stenosis. Both common carotid arteries are widely patent to the bifurcation. Both carotid bifurcations are normal without stenosis or irregularity. Both cervical internal carotid arteries are normal. Both vertebral arteries are widely patent at their origins and through the cervical region.  IMPRESSION: Normal MR angiography of the neck vessels.  Normal intracranial MR angiography of the large and medium size vessels.  No acute brain finding. 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 Differential diagnosis does include demyelinating disease, but that is felt considerably less likely.   Electronically Signed   By: Paulina Fusi M.D.   On: 06/01/2015 11:53   Mr Palma Holter  06/01/2015   CLINICAL DATA:  Hypertension. Episode of diaphoresis and palpitations. Personal history of previous stroke.  EXAM: MRI HEAD WITHOUT AND WITH CONTRAST  MRA HEAD WITHOUT CONTRAST  MRA NECK WITHOUT AND WITH CONTRAST  TECHNIQUE: Multiplanar, multiecho pulse sequences of the brain and surrounding structures were obtained without and with intravenous contrast. Angiographic images of the Circle of Willis were  obtained using MRA technique without intravenous contrast. Angiographic images of the neck were obtained using MRA technique without and with intravenous contrast. Carotid stenosis measurements (when applicable) are obtained utilizing NASCET criteria, using the distal internal carotid diameter as the denominator.  CONTRAST:  20mL MULTIHANCE GADOBENATE DIMEGLUMINE 529 MG/ML IV SOLN  COMPARISON:  None.  FINDINGS: MRI HEAD FINDINGS  Diffusion imaging does not show any acute or subacute infarction. The brainstem and cerebellum are normal. Within the cerebral hemispheres, there are scattered foci of T2 and FLAIR signal consistent with old small vessel infarctions. The differential diagnosis is that of demyelinating disease. No cortical or large vessel territory infarction. No mass lesion, hemorrhage, hydrocephalus or extra-axial collection. No pituitary mass. No inflammatory sinus disease. No skull or skullbase lesion.  MRA HEAD FINDINGS  Both internal carotid arteries are widely patent into the brain. No siphon stenosis. The anterior and middle cerebral vessels are normal without proximal stenosis, aneurysm or vascular malformation. Both vertebral arteries are widely patent to the basilar. No basilar stenosis. Posterior circulation branch vessels are normal.  MRA NECK FINDINGS  Branching pattern of the brachiocephalic vessels from the arch is normal. No origin stenosis. Both common carotid arteries are widely patent to the bifurcation. Both carotid bifurcations are normal without stenosis or irregularity. Both cervical internal carotid arteries are normal. Both vertebral arteries are widely patent at their origins and through the cervical region.  IMPRESSION: Normal MR angiography of the neck vessels.  Normal intracranial MR angiography of the large and medium size vessels.  No acute brain finding. Chronic appearing foci of T2 and FLAIR signal in the cerebral hemispheric white matter most consistent with old small  vessel infarctions. Differential diagnosis does include demyelinating disease, but that is felt considerably less likely.   Electronically Signed   By: Paulina Fusi M.D.   On: 06/01/2015 11:53    EKG:   Orders placed or performed during the hospital encounter of 05/31/15  . ED EKG within 10 minutes  . ED EKG within 10 minutes  . EKG 12-Lead  . 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 Would anticipate starting aspirin.  #2 dysuria - UA is negative for current infection. Urine culture pending. She continues on doxycycline and ciprofloxacin.  #3 COPD: - No exacerbation. Continue as needed inhalers  #4 sarcoidosis: Stable.  All the records are reviewed and case discussed with Care Management/Social Workerr. Management plans discussed with the patient, family and they are in agreement.  CODE STATUS: Full  TOTAL TIME TAKING CARE OF THIS PATIENT:35 minutes.  Greater than 50% of time spent in care coordination and counseling. POSSIBLE D/C TODAY   Elby Showers M.D on 06/01/2015 at 1:08 PM  Between 7am to 6pm - Pager - [phone]  After 6pm go to [email or web address] - password [email or web address] [facility]  [facility]  Office  [phone]  CC: Primary care physician; Berneice Gandy, MD

## 2015-06-01 NOTE — Progress Notes (Signed)
*  PRELIMINARY RESULTS* Echocardiogram 2D Echocardiogram has been performed.  Grace Olson Stills 06/01/2015, 9:25 AM

## 2015-06-01 NOTE — Consult Note (Signed)
Reason for Consult: stroke Referring Physician: Dr. Ralph Dowdy is an 44 y.o. female.  HPI: seen at request of Dr. Volanda Napoleon for stroke;  44 yo RHD F presents to [facility] due to L hand and foot numbness that continues.  Pt states that she has had this before but that it has never lasted more than 30 minutes.  Pt denies headache with this episode but does note headaches twice a week.  Pt denies any N/V or photophobia.  No neck pain reported.  Past Medical History  Diagnosis Date  . UTI (lower urinary tract infection)   . Hypertension   . Sarcoid   . COPD (chronic obstructive pulmonary disease)   . Stroke     Past Surgical History  Procedure Laterality Date  . Cesarean section      2005  . Bronchoscopy      Family History  Problem Relation Age of Onset  . Heart failure Mother   . Lung cancer Father   . Hypertension Mother   . Stroke Mother   . Hyperlipidemia Mother   . CAD Mother   . Hypertension Father   . Diabetes Sister   . CAD Maternal Grandmother   . Hypertension Maternal Grandmother   . Prostate cancer Maternal Grandfather   . Colon cancer Maternal Uncle     Social History:  reports that she has quit smoking. She has never used smokeless tobacco. She reports that she drinks alcohol. She reports that she does not use illicit drugs.  Allergies: No Known Allergies  Medications: personally reviewed by me as per chart  Results for orders placed or performed during the hospital encounter of 05/31/15 (from the past 48 hour(s))  CBC     Status: None   Collection Time: 05/31/15  8:24 PM  Result Value Ref Range   WBC 7.9 3.6 - 11.0 K/uL   RBC 4.09 3.80 - 5.20 MIL/uL   Hemoglobin 13.0 12.0 - 16.0 g/dL   HCT 38.5 35.0 - 47.0 %   MCV 94.1 80.0 - 100.0 fL   MCH 31.7 26.0 - 34.0 pg   MCHC 33.7 32.0 - 36.0 g/dL   RDW 13.3 11.5 - 14.5 %   Platelets 289 150 - 440 K/uL  Troponin I     Status: None   Collection Time: 05/31/15  8:24 PM  Result Value Ref Range   Troponin I  <0.03 <0.031 ng/mL    Comment:        NO INDICATION OF MYOCARDIAL INJURY.   Comprehensive metabolic panel     Status: None   Collection Time: 05/31/15  8:24 PM  Result Value Ref Range   Sodium 138 135 - 145 mmol/L   Potassium 3.5 3.5 - 5.1 mmol/L   Chloride 103 101 - 111 mmol/L   CO2 28 22 - 32 mmol/L   Glucose, Bld 90 65 - 99 mg/dL   BUN 17 6 - 20 mg/dL   Creatinine, Ser 0.89 0.44 - 1.00 mg/dL   Calcium 9.1 8.9 - 10.3 mg/dL   Total Protein 7.9 6.5 - 8.1 g/dL   Albumin 4.1 3.5 - 5.0 g/dL   AST 26 15 - 41 U/L   ALT 17 14 - 54 U/L   Alkaline Phosphatase 71 38 - 126 U/L   Total Bilirubin 0.7 0.3 - 1.2 mg/dL   GFR calc non Af Amer >60 >60 mL/min   GFR calc Af Amer >60 >60 mL/min    Comment: (NOTE) The eGFR has  been calculated using the CKD EPI equation. This calculation has not been validated in all clinical situations. eGFR's persistently <60 mL/min signify possible Chronic Kidney Disease.    Anion gap 7 5 - 15  Pregnancy, urine POC     Status: None   Collection Time: 05/31/15  8:29 PM  Result Value Ref Range   Preg Test, Ur NEGATIVE NEGATIVE    Comment:        THE SENSITIVITY OF THIS METHODOLOGY IS >24 mIU/mL   Urinalysis complete, with microscopic ([facility] only)     Status: Abnormal   Collection Time: 05/31/15  8:33 PM  Result Value Ref Range   Color, Urine YELLOW (A) YELLOW   APPearance CLEAR (A) CLEAR   Glucose, UA NEGATIVE NEGATIVE mg/dL   Bilirubin Urine NEGATIVE NEGATIVE   Ketones, ur NEGATIVE NEGATIVE mg/dL   Specific Gravity, Urine 1.015 1.005 - 1.030   Hgb urine dipstick 2+ (A) NEGATIVE   pH 7.0 5.0 - 8.0   Protein, ur NEGATIVE NEGATIVE mg/dL   Nitrite NEGATIVE NEGATIVE   Leukocytes, UA TRACE (A) NEGATIVE   RBC / HPF 6-30 0 - 5 RBC/hpf   WBC, UA 0-5 0 - 5 WBC/hpf   Bacteria, UA MANY (A) NONE SEEN   Squamous Epithelial / LPF 0-5 (A) NONE SEEN   Mucous PRESENT   Basic metabolic panel     Status: Abnormal   Collection Time: 06/01/15  2:11 AM  Result Value  Ref Range   Sodium 138 135 - 145 mmol/L   Potassium 3.5 3.5 - 5.1 mmol/L   Chloride 105 101 - 111 mmol/L   CO2 27 22 - 32 mmol/L   Glucose, Bld 91 65 - 99 mg/dL   BUN 13 6 - 20 mg/dL   Creatinine, Ser 0.72 0.44 - 1.00 mg/dL   Calcium 8.5 (L) 8.9 - 10.3 mg/dL   GFR calc non Af Amer >60 >60 mL/min   GFR calc Af Amer >60 >60 mL/min    Comment: (NOTE) The eGFR has been calculated using the CKD EPI equation. This calculation has not been validated in all clinical situations. eGFR's persistently <60 mL/min signify possible Chronic Kidney Disease.    Anion gap 6 5 - 15  CBC     Status: None   Collection Time: 06/01/15  2:11 AM  Result Value Ref Range   WBC 8.8 3.6 - 11.0 K/uL   RBC 3.81 3.80 - 5.20 MIL/uL   Hemoglobin 12.3 12.0 - 16.0 g/dL   HCT 35.6 35.0 - 47.0 %   MCV 93.5 80.0 - 100.0 fL   MCH 32.3 26.0 - 34.0 pg   MCHC 34.5 32.0 - 36.0 g/dL   RDW 13.4 11.5 - 14.5 %   Platelets 235 150 - 440 K/uL  Troponin I     Status: None   Collection Time: 06/01/15  2:11 AM  Result Value Ref Range   Troponin I <0.03 <0.031 ng/mL    Comment:        NO INDICATION OF MYOCARDIAL INJURY.   Lipid panel     Status: None   Collection Time: 06/01/15  2:11 AM  Result Value Ref Range   Cholesterol 152 0 - 200 mg/dL   Triglycerides 26 <150 mg/dL   HDL 55 >40 mg/dL   Total CHOL/HDL Ratio 2.8 RATIO   VLDL 5 0 - 40 mg/dL   LDL Cholesterol 92 0 - 99 mg/dL    Comment:        Total Cholesterol/HDL:CHD  Risk Coronary Heart Disease Risk Table                     Men   Women  1/2 Average Risk   3.4   3.3  Average Risk       5.0   4.4  2 X Average Risk   9.6   7.1  3 X Average Risk  23.4   11.0        Use the calculated Patient Ratio above and the CHD Risk Table to determine the patient's CHD Risk.        ATP III CLASSIFICATION (LDL):  <100     mg/dL   Optimal  100-129  mg/dL   Near or Above                    Optimal  130-159  mg/dL   Borderline  160-189  mg/dL   High  >190     mg/dL    Very High   Troponin I     Status: None   Collection Time: 06/01/15  6:26 AM  Result Value Ref Range   Troponin I <0.03 <0.031 ng/mL    Comment:        NO INDICATION OF MYOCARDIAL INJURY.   Troponin I     Status: None   Collection Time: 06/01/15  1:18 PM  Result Value Ref Range   Troponin I <0.03 <0.031 ng/mL    Comment:        NO INDICATION OF MYOCARDIAL INJURY.     Dg Chest 2 View  05/31/2015   CLINICAL DATA:  Numbness. Left arm and leg tingling for 9 hours. Shortness of breath.  EXAM: CHEST  2 VIEW  COMPARISON:  09/12/2010  FINDINGS: The cardiomediastinal contours are normal. Minimal linear atelectasis or scarring in the left upper lung zone. Pulmonary vasculature is normal. No consolidation, pleural effusion, or pneumothorax. No acute osseous abnormalities are seen.  IMPRESSION: No acute pulmonary process.   Electronically Signed   By: Jeb Levering M.D.   On: 05/31/2015 21:40   Ct Head Wo Contrast  05/31/2015   CLINICAL DATA:  Left arm numbness and tingling  EXAM: CT HEAD WITHOUT CONTRAST  TECHNIQUE: Contiguous axial images were obtained from the base of the skull through the vertex without intravenous contrast.  COMPARISON:  None.  FINDINGS: No skull fracture is noted. Paranasal sinuses and mastoid air cells are unremarkable. No intracranial hemorrhage, mass effect or midline shift. No acute cortical infarction. No mass lesion is noted on this unenhanced scan. No hydrocephalus. No intra or extra-axial fluid collection.  IMPRESSION: No acute intracranial abnormality.   Electronically Signed   By: Lahoma Crocker M.D.   On: 05/31/2015 20:46   Mr Angiogram Neck W Wo Contrast  06/01/2015   CLINICAL DATA:  Hypertension. Episode of diaphoresis and palpitations. Personal history of previous stroke.  EXAM: MRI HEAD WITHOUT AND WITH CONTRAST  MRA HEAD WITHOUT CONTRAST  MRA NECK WITHOUT AND WITH CONTRAST  TECHNIQUE: Multiplanar, multiecho pulse sequences of the brain and surrounding structures  were obtained without and with intravenous contrast. Angiographic images of the Circle of Willis were obtained using MRA technique without intravenous contrast. Angiographic images of the neck were obtained using MRA technique without and with intravenous contrast. Carotid stenosis measurements (when applicable) are obtained utilizing NASCET criteria, using the distal internal carotid diameter as the denominator.  CONTRAST:  40m MULTIHANCE GADOBENATE DIMEGLUMINE 529 MG/ML IV SOLN  COMPARISON:  None.  FINDINGS: MRI HEAD FINDINGS  Diffusion imaging does not show any acute or subacute infarction. The brainstem and cerebellum are normal. Within the cerebral hemispheres, there are scattered foci of T2 and FLAIR signal consistent with old small vessel infarctions. The differential diagnosis is that of demyelinating disease. No cortical or large vessel territory infarction. No mass lesion, hemorrhage, hydrocephalus or extra-axial collection. No pituitary mass. No inflammatory sinus disease. No skull or skullbase lesion.  MRA HEAD FINDINGS  Both internal carotid arteries are widely patent into the brain. No siphon stenosis. The anterior and middle cerebral vessels are normal without proximal stenosis, aneurysm or vascular malformation. Both vertebral arteries are widely patent to the basilar. No basilar stenosis. Posterior circulation branch vessels are normal.  MRA NECK FINDINGS  Branching pattern of the brachiocephalic vessels from the arch is normal. No origin stenosis. Both common carotid arteries are widely patent to the bifurcation. Both carotid bifurcations are normal without stenosis or irregularity. Both cervical internal carotid arteries are normal. Both vertebral arteries are widely patent at their origins and through the cervical region.  IMPRESSION: Normal MR angiography of the neck vessels.  Normal intracranial MR angiography of the large and medium size vessels.  No acute brain finding. 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 Differential diagnosis does include demyelinating disease, but that is felt considerably less likely.   Electronically Signed   By: Nelson Chimes M.D.   On: 06/01/2015 11:53   Mr Brain Wo Contrast  06/01/2015   CLINICAL DATA:  Hypertension. Episode of diaphoresis and palpitations. Personal history of previous stroke.  EXAM: MRI HEAD WITHOUT AND WITH CONTRAST  MRA HEAD WITHOUT CONTRAST  MRA NECK WITHOUT AND WITH CONTRAST  TECHNIQUE: Multiplanar, multiecho pulse sequences of the brain and surrounding structures were obtained without and with intravenous contrast. Angiographic images of the Circle of Willis were obtained using MRA technique without intravenous contrast. Angiographic images of the neck were obtained using MRA technique without and with intravenous contrast. Carotid stenosis measurements (when applicable) are obtained utilizing NASCET criteria, using the distal internal carotid diameter as the denominator.  CONTRAST:  71m MULTIHANCE GADOBENATE DIMEGLUMINE 529 MG/ML IV SOLN  COMPARISON:  None.  FINDINGS: MRI HEAD FINDINGS  Diffusion imaging does not show any acute or subacute infarction. The brainstem and cerebellum are normal. Within the cerebral hemispheres, there are scattered foci of T2 and FLAIR signal consistent with old small vessel infarctions. The differential diagnosis is that of demyelinating disease. No cortical or large vessel territory infarction. No mass lesion, hemorrhage, hydrocephalus or extra-axial collection. No pituitary mass. No inflammatory sinus disease. No skull or skullbase lesion.  MRA HEAD FINDINGS  Both internal carotid arteries are widely patent into the brain. No siphon stenosis. The anterior and middle cerebral vessels are normal without proximal stenosis, aneurysm or vascular malformation. Both vertebral arteries are widely patent to the basilar. No  basilar stenosis. Posterior circulation branch vessels are normal.  MRA NECK FINDINGS  Branching pattern of the brachiocephalic vessels from the arch is normal. No origin stenosis. Both common carotid arteries are widely patent to the bifurcation. Both carotid bifurcations are normal without stenosis or irregularity. Both cervical internal carotid arteries are normal. Both vertebral arteries are widely patent at their origins and through the cervical region.  IMPRESSION: Normal MR angiography of the neck vessels.  Normal intracranial MR angiography of the large and medium size vessels.  No acute brain finding. 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 Differential diagnosis does include demyelinating disease, but that is felt considerably less likely.   Electronically Signed   By: Nelson Chimes M.D.   On: 06/01/2015 11:53   Mr Lovenia Kim  06/01/2015   CLINICAL DATA:  Hypertension. Episode of diaphoresis and palpitations. Personal history of previous stroke.  EXAM: MRI HEAD WITHOUT AND WITH CONTRAST  MRA HEAD WITHOUT CONTRAST  MRA NECK WITHOUT AND WITH CONTRAST  TECHNIQUE: Multiplanar, multiecho pulse sequences of the brain and surrounding structures were obtained without and with intravenous contrast. Angiographic images of the Circle of Willis were obtained using MRA technique without intravenous contrast. Angiographic images of the neck were obtained using MRA technique without and with intravenous contrast. Carotid stenosis measurements (when applicable) are obtained utilizing NASCET criteria, using the distal internal carotid diameter as the denominator.  CONTRAST:  2m MULTIHANCE GADOBENATE DIMEGLUMINE 529 MG/ML IV SOLN  COMPARISON:  None.  FINDINGS: MRI HEAD FINDINGS  Diffusion imaging does not show any acute or subacute infarction. The brainstem and cerebellum are normal. Within the cerebral hemispheres, there  are scattered foci of T2 and FLAIR signal consistent with old small vessel infarctions. The differential diagnosis is that of demyelinating disease. No cortical or large vessel territory infarction. No mass lesion, hemorrhage, hydrocephalus or extra-axial collection. No pituitary mass. No inflammatory sinus disease. No skull or skullbase lesion.  MRA HEAD FINDINGS  Both internal carotid arteries are widely patent into the brain. No siphon stenosis. The anterior and middle cerebral vessels are normal without proximal stenosis, aneurysm or vascular malformation. Both vertebral arteries are widely patent to the basilar. No basilar stenosis. Posterior circulation branch vessels are normal.  MRA NECK FINDINGS  Branching pattern of the brachiocephalic vessels from the arch is normal. No origin stenosis. Both common carotid arteries are widely patent to the bifurcation. Both carotid bifurcations are normal without stenosis or irregularity. Both cervical internal carotid arteries are normal. Both vertebral arteries are widely patent at their origins and through the cervical region.  IMPRESSION: Normal MR angiography of the neck vessels.  Normal intracranial MR angiography of the large and medium size vessels.  No acute brain finding. 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 Differential diagnosis does include demyelinating disease, but that is felt considerably less likely.   Electronically Signed   By: MNelson ChimesM.D.   On: 06/01/2015 11:53    Review of Systems  Constitutional: Negative.   HENT: Negative for congestion, ear discharge, ear pain, hearing loss, nosebleeds, sore throat and tinnitus.   Eyes: Negative.   Respiratory: Negative.  Negative for stridor.   Cardiovascular: Negative.   Gastrointestinal: Negative.   Genitourinary: Negative.   Musculoskeletal: Positive for myalgias. Negative for back pain, joint  pain, falls and neck pain.  Skin: Negative.   Neurological: Positive for sensory change and headaches. Negative for dizziness, tingling, tremors, speech change, focal weakness, seizures and loss of consciousness.  Psychiatric/Behavioral: Negative.    Blood pressure 118/62, pulse 61, temperature 98 F (36.7 C), temperature source Oral, resp. rate 20, height _0  (1.753 m), weight 103.874 kg (229 lb), last menstrual period 05/14/2015, SpO2 97 %. Physical Exam  Nursing note and vitals reviewed. Constitutional: She appears well-developed and well-nourished. No distress.  HENT:  Head: Normocephalic and atraumatic.  Right  Ear: External ear normal.  Left Ear: External ear normal.  Nose: Nose normal.  Mouth/Throat: Oropharynx is clear and moist.  Eyes: Conjunctivae and EOM are normal. Pupils are equal, round, and reactive to light. No scleral icterus.  Neck: Normal range of motion. Neck supple.  Cardiovascular: Normal rate, regular rhythm, normal heart sounds and intact distal pulses.   No murmur heard. Respiratory: Effort normal and breath sounds normal. No respiratory distress.  GI: Soft. Bowel sounds are normal. She exhibits no distension.  Musculoskeletal: Normal range of motion.  Neurological:  A+Ox3, nl speech and language PERRLA, EOMI, nl VF, face symmetric, tongue midline 5/5 B, nl tone FTN and HTS WNL 1+/4 B, downgoing plantars B Nl sensation to pin and temp  Skin: Skin is warm and dry. She is not diaphoretic.  Psychiatric: She has a normal mood and affect.   MRI of brain personally reviewed by me and shows moderate white matter changes that are out of proportion to someone that age this could be demyelinating disease  Assessment/Plan: 1.  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  Can not r/o ischemic causes as well.  Pt could also have these MRI changes with chronic headaches. 2.  Headaches-  Not clear etiology -  MRI of brain  and c-spine w/wo contrast as outpatient in 1-2 weeks -  Start ASA 58m daily -  Check B12/folate, ESR, CRP and Vit D levels -  Tried to get pt to stay for w/u but she is adament about going home -  Will sign off, please call with questions -  Needs to f/u with [facility] in 3-4 weeks   Tatayana Beshears 06/01/2015, 2:30 PM

## 2015-06-01 NOTE — Plan of Care (Signed)
Problem: Discharge/Transitional Outcomes Goal: Barriers To Progression Addressed/Resolved Pt with new onset tingling to left hand and foot Hx of HTN, COPD: controlled with home meds

## 2015-06-03 LAB — URINE CULTURE

## 2015-06-03 NOTE — Discharge Summary (Signed)
[facility] - [place] at [facility]  DISCHARGE SUMMARY   PATIENT NAME: Grace Olson    MR#:  [id number]  DATE OF BIRTH:  Jun 24, 1971  DATE OF ADMISSION:  05/31/2015 ADMITTING PHYSICIAN: Oralia Manis, MD  DATE OF DISCHARGE: 06/01/2015  PRIMARY CARE PHYSICIAN: Berneice Gandy, MD    ADMISSION DIAGNOSIS:  Diaphoresis [R61] Numbness and tingling in left arm [R20.0] Numbness of left foot [R20.8]  DISCHARGE DIAGNOSIS:  Principal Problem:   CVA (cerebral infarction) Active Problems:   HTN (hypertension)   COPD (chronic obstructive pulmonary disease)   Sarcoid   H/O: stroke   SECONDARY DIAGNOSIS:   Past Medical History  Diagnosis Date  . UTI (lower urinary tract infection)   . Hypertension   . Sarcoid   . COPD (chronic obstructive pulmonary disease)   . Stroke     HOSPITAL COURSE:    #1 left hand and foot paresthesia: CT negative, MRI shows old infarct (patient was aware of this prior to admission ) no new infarct. She was seen by neurology during hospitalization and MRI with contrast was recommended. As this was not possible until the next day the patient requested to be discharged with outpatient follow-up. She will need to see her primary care physician to have this scheduled. Dr. 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 Echo with ejection fraction 60% and grade 1 diastolic dysfunction. Physical occupational and speech therapy have seen her and no follow-up is needed. Lipid panel and A1c OK. Starting aspirin 81 mg daily.  #2 dysuria: UA is negative for current infection. Urine culture with mixed bacterial species. She continues on the doxycycline and ciprofloxacin that she was on prior to admission.   #3 COPD: - No exacerbation. Continue as needed inhalers  #4 sarcoidosis: Stable.  DISCHARGE CONDITIONS:   Stable  CONSULTS OBTAINED:  Treatment Team:  Mellody Drown, MD  DRUG  ALLERGIES:  No Known Allergies  DISCHARGE MEDICATIONS:   Discharge Medication List as of 06/01/2015  2:48 PM    START taking these medications   Details  aspirin 81 MG tablet Take 1 tablet (81 mg total) by mouth daily., Starting 06/01/2015, Until Discontinued, Normal      CONTINUE these medications which have NOT CHANGED   Details  albuterol (PROVENTIL HFA;VENTOLIN HFA) 108 (90 BASE) MCG/ACT inhaler Inhale 2 puffs into the lungs every 6 (six) hours as needed for wheezing or shortness of breath., Until Discontinued, Historical Med    bisoprolol-hydrochlorothiazide (ZIAC) 2.5-6.25 MG per tablet Take 1 tablet by mouth daily., Until Discontinued, Historical Med    fluconazole (DIFLUCAN) 150 MG tablet Take one tablet now, and repeat in a week if needed, Normal    naproxen sodium (ANAPROX) 220 MG tablet Take 440 mg by mouth 2 (two) times daily as needed (for pain/headache)., Until Discontinued, Historical Med    doxycycline (VIBRA-TABS) 100 MG tablet Take 100 mg by mouth 2 (two) times daily., Starting 05/31/2015, Until Fri 06/07/15, Historical Med         DISCHARGE INSTRUCTIONS:   Discharged in stable condition. Heart healthy diet. No physical therapy or other home health needs.  If you experience worsening of your admission symptoms, develop shortness of breath, life threatening emergency, suicidal or homicidal thoughts you must seek medical attention immediately by calling 911 or calling your MD immediately  if symptoms less severe.  You Must read complete instructions/literature along with all the possible adverse reactions/side effects for all the  Medicines you take and that have been prescribed to you. Take any new Medicines after you have completely understood and accept all the possible adverse reactions/side effects.   Please note  You were cared for by a hospitalist during your hospital stay. If you have any questions about your discharge medications or the care you received  while you were in the hospital after you are discharged, you can call the unit and asked to speak with the hospitalist on call if the hospitalist that took care of you is not available. Once you are discharged, your primary care physician will handle any further medical issues. Please note that NO REFILLS for any discharge medications will be authorized once you are discharged, as it is imperative that you return to your primary care physician (or establish a relationship with a primary care physician if you do not have one) for your aftercare needs so that they can reassess your need for medications and monitor your lab values.    Today   CHIEF COMPLAINT:   Chief Complaint  Patient presents with  . Excessive Sweating  . Numbness  . Shortness of Breath    HISTORY OF PRESENT ILLNESS:  Grace Olson is a 44 y.o. female who presents with numbness of the left upper and lower extremities. Patient states that symptoms started today, and have persisted. She states that she's had episodes in the past where she had some amount of numbness for brief periods of time, but those resolved quickly. Symptoms today have not resolved. She also states that she had some shortness of breath earlier on in the day with an episode of diaphoresis. She has a history of sarcoidosis, which she states is well controlled. She has had an MRI proven stroke in the past per her report. Her symptoms with her prior stroke were focal lower extremity weakness, in conjunction with sensory deficit in the form of numbness. Her symptoms completely resolved after that stroke. Lab and imaging workup in the ED is initially benign. Patient does state that she has history of frequent UTIs, for which she sees urology. She states that she feels like she is developing UTI over the last couple of days. UA in the ED is not strongly suggestive for UTI, but does have a small amount of blood. Hospitalists were called for admission for workup of her  numbness, with suspicion of TIA/CVA.  VITAL SIGNS:  Blood pressure 118/62, pulse 61, temperature 98 F (36.7 C), temperature source Oral, resp. rate 20, height  (1.753 m), weight 103.874 kg (229 lb), last menstrual period 05/14/2015, SpO2 97 %.  I/O:  No intake or output data in the 24 hours ending 06/03/15 1425  PHYSICAL EXAMINATION:  GENERAL:  44 y.o.-year-old patient lying in the bed with no acute distress.  EYES: Pupils equal, round, reactive to light and accommodation. No scleral icterus. Extraocular muscles intact.  HEENT: Head atraumatic, normocephalic. Oropharynx and nasopharynx clear.  NECK:  Supple, no jugular venous distention. No thyroid enlargement, no tenderness.  LUNGS: Normal breath sounds bilaterally, no wheezing, rales,rhonchi or crepitation. No use of accessory muscles of respiration.  CARDIOVASCULAR: S1, S2 normal. No murmurs, rubs, or gallops.  ABDOMEN: Soft, non-tender, non-distended. Bowel sounds present. No organomegaly or mass.  EXTREMITIES: No pedal edema, cyanosis, or clubbing.  NEUROLOGIC: Cranial nerves II through XII are intact. Muscle strength 5/5 in all extremities. Sensation intact. Gait not checked.  PSYCHIATRIC: The patient is alert and oriented x 3.  SKIN: No obvious rash, lesion, or  ulcer.   DATA REVIEW:   CBC  Recent Labs Lab 06/01/15 0211  WBC 8.8  HGB 12.3  HCT 35.6  PLT 235    Chemistries   Recent Labs Lab 05/31/15 2024 06/01/15 0211  NA 138 138  K 3.5 3.5  CL 103 105  CO2 28 27  GLUCOSE 90 91  BUN 17 13  CREATININE 0.89 0.72  CALCIUM 9.1 8.5*  AST 26  --   ALT 17  --   ALKPHOS 71  --   BILITOT 0.7  --     Cardiac Enzymes  Recent Labs Lab 06/01/15 1318  TROPONINI <0.03    Microbiology Results  Results for orders placed or performed during the hospital encounter of 05/31/15  Urine culture     Status: None   Collection Time: 05/31/15  8:33 PM  Result Value Ref Range Status   Specimen Description URINE,  RANDOM  Final   Special Requests NONE  Final   Culture   Final    MULTIPLE SPECIES PRESENT, SUGGEST RECOLLECTION 20,000 COLONIES/mL GROUP B STREP(S.AGALACTIAE)ISOLATED Virtually 100% of S. agalactiae (Group B) strains are susceptible to Penicillin.  For Penicillin-allergic patients, Erythromycin (85-95% sensitive) and Clindamycin (80% sensitive) are drugs of choice. Contact microbiology lab to request sensitivities if  needed within 7 days.    Report Status 06/03/2015 FINAL  Final    RADIOLOGY:  No results found.  EKG:   Orders placed or performed during the hospital encounter of 05/31/15  . ED EKG within 10 minutes  . ED EKG within 10 minutes  . EKG 12-Lead  . EKG 12-Lead  . EKG      Management plans discussed with the patient, family and they are in agreement.  CODE STATUS: Full  TOTAL TIME TAKING CARE OF THIS PATIENT: 35 minutes.  Greater than 50% of time spent in care coordination and counseling.  Elby Showers M.D on 06/03/2015 at 2:25 PM  Between 7am to 6pm - Pager - [phone]  After 6pm go to [email or web address] - password [email or web address] [facility]  [place] [facility]  Office  [phone]  CC: Primary care physician; Berneice Gandy, MD

## 2015-06-06 LAB — VITAMIN D 1,25 DIHYDROXY
Vitamin D 1, 25 (OH)2 Total: 69 pg/mL
Vitamin D2 1, 25 (OH)2: <10 pg/mL
Vitamin D3 1, 25 (OH)2: 69 pg/mL

## 2015-06-28 ENCOUNTER — Other Ambulatory Visit: Payer: Self-pay | Admitting: Neurology

## 2015-06-28 DIAGNOSIS — R200 Anesthesia of skin: Secondary | ICD-10-CM

## 2015-07-13 ENCOUNTER — Ambulatory Visit
Admission: RE | Admit: 2015-07-13 | Discharge: 2015-07-13 | Disposition: A | Discharge status: Home / Self Care | Payer: BLUE CROSS/BLUE SHIELD | Source: Ambulatory Visit | Attending: Neurology | Admitting: Neurology

## 2015-07-13 DIAGNOSIS — R200 Anesthesia of skin: Secondary | ICD-10-CM

## 2015-07-30 ENCOUNTER — Other Ambulatory Visit: Payer: Self-pay | Admitting: Neurology

## 2015-07-30 DIAGNOSIS — R202 Paresthesia of skin: Principal | ICD-10-CM

## 2015-07-30 DIAGNOSIS — R200 Anesthesia of skin: Secondary | ICD-10-CM

## 2015-08-05 ENCOUNTER — Ambulatory Visit: Payer: BLUE CROSS/BLUE SHIELD

## 2015-08-14 ENCOUNTER — Ambulatory Visit
Admission: RE | Admit: 2015-08-14 | Discharge: 2015-08-14 | Disposition: A | Discharge status: Home / Self Care | Payer: BLUE CROSS/BLUE SHIELD | Source: Ambulatory Visit | Attending: Neurology | Admitting: Neurology

## 2015-08-14 DIAGNOSIS — R51 Headache: Secondary | ICD-10-CM | POA: Diagnosis present

## 2015-08-14 DIAGNOSIS — R202 Paresthesia of skin: Secondary | ICD-10-CM | POA: Insufficient documentation

## 2015-08-14 DIAGNOSIS — R200 Anesthesia of skin: Secondary | ICD-10-CM

## 2015-08-14 LAB — HCG, QUANTITATIVE, PREGNANCY

## 2015-08-14 LAB — PROTIME-INR
INR: 0.9
Prothrombin Time: 12.4 seconds (ref 11.4–15.0)

## 2015-08-14 LAB — APTT: APTT: 27 s (ref 24–36)

## 2015-08-14 LAB — CSF CELL COUNT WITH DIFFERENTIAL
EOS CSF: 0 %
Lymphs, CSF: 67 %
Monocyte-Macrophage-Spinal Fluid: 0 %
OTHER CELLS CSF: 0
RBC Count, CSF: 43 /mm3 — ABNORMAL HIGH (ref 0–3)
RBC Count, CSF: 63 /mm3 — ABNORMAL HIGH (ref 0–3)
Segmented Neutrophils-CSF: 33 %
TUBE #: 1
Tube #: 4
WBC CSF: 0 /mm3
WBC CSF: 3 /mm3

## 2015-08-14 LAB — CBC
HEMATOCRIT: 38.3 % (ref 35.0–47.0)
Hemoglobin: 12.7 g/dL (ref 12.0–16.0)
MCH: 31.2 pg (ref 26.0–34.0)
MCHC: 33.2 g/dL (ref 32.0–36.0)
MCV: 94 fL (ref 80.0–100.0)
PLATELETS: 256 10*3/uL (ref 150–440)
RBC: 4.07 MIL/uL (ref 3.80–5.20)
RDW: 13.4 % (ref 11.5–14.5)
WBC: 7.3 10*3/uL (ref 3.6–11.0)

## 2015-08-14 LAB — ALBUMIN: Albumin: 3.3 g/dL — ABNORMAL LOW (ref 3.5–5.0)

## 2015-08-14 LAB — GLUCOSE, CSF: Glucose, CSF: 57 mg/dL (ref 40–70)

## 2015-08-14 LAB — PROTEIN, CSF: Total  Protein, CSF: 19 mg/dL (ref 15–45)

## 2015-08-14 MED ORDER — ACETAMINOPHEN 325 MG PO TABS
650.0000 mg | ORAL_TABLET | Freq: Once | ORAL | Status: AC
  Administered 2015-08-14: 650 mg via ORAL
  Filled 2015-08-14: qty 2

## 2015-08-14 MED ORDER — ACETAMINOPHEN 500 MG PO TABS
1000.0000 mg | ORAL_TABLET | Freq: Four times a day (QID) | ORAL | Status: DC | PRN
  Filled 2015-08-14: qty 2

## 2015-08-14 NOTE — Progress Notes (Signed)
Patient alert and oriented. Reporting slight headache, MD notified and PRN administered. Site WNL, bandaid clean dry intact. Voided x 2 since return from procedure. Discharged to home per MD orders. Discharge paperwork reviewed with patient and husband. Will escort out via wheelchair.

## 2015-08-17 LAB — CSF CULTURE W GRAM STAIN

## 2015-08-17 LAB — ANGIOTENSIN CONVERTING ENZYME, CSF: Angio Convert Enzyme: 0.8 U/L (ref 0.0–2.5)

## 2015-08-17 LAB — JC VIRUS, PCR CSF: JC VIRUS PCR (CSF): NEGATIVE

## 2015-08-17 LAB — CSF CULTURE: CULTURE: NO GROWTH

## 2015-08-17 LAB — OLIGOCLONAL BANDS, CSF + SERM

## 2016-03-01 ENCOUNTER — Ambulatory Visit
Admission: EM | Admit: 2016-03-01 | Discharge: 2016-03-01 | Disposition: A | Discharge status: Home / Self Care | Payer: BLUE CROSS/BLUE SHIELD | Attending: Family Medicine | Admitting: Family Medicine

## 2016-03-01 ENCOUNTER — Encounter: Payer: Self-pay | Admitting: Emergency Medicine

## 2016-03-01 DIAGNOSIS — J069 Acute upper respiratory infection, unspecified: Secondary | ICD-10-CM

## 2016-03-01 MED ORDER — AZITHROMYCIN 250 MG PO TABS: ORAL_TABLET | ORAL | Status: AC

## 2016-03-01 NOTE — ED Notes (Signed)
Cough, sore throat, congested for 4 days

## 2016-03-01 NOTE — ED Provider Notes (Signed)
CSN: [id number]     Arrival date & time 03/01/16  0804 History   First MD Initiated Contact with Patient 03/01/16 [phone]     Chief Complaint  Patient presents with  . Sore Throat   (Consider location/radiation/quality/duration/timing/severity/associated sxs/prior Treatment) HPI: She presents today with symptoms of cough, sore throat, nasal congestion 4 days. She denies any fever, severe headache, nausea, vomiting, chest pain, shortness of breath. Patient does have a history of sarcoidosis but states that she has no respiratory issues related to it. She has not had any problems with her sarcoid in quite some time. She does not take any prednisone on a regular basis. She does have an inhaler as needed. She denies any history of seasonal allergies.  Past Medical History  Diagnosis Date  . UTI (lower urinary tract infection)   . Hypertension   . Sarcoid (HCC)   . COPD (chronic obstructive pulmonary disease) (HCC)   . Stroke [facility])    Past Surgical History  Procedure Laterality Date  . Cesarean section      2005  . Bronchoscopy     Family History  Problem Relation Age of Onset  . Heart failure Mother   . Lung cancer Father   . Hypertension Mother   . Stroke Mother   . Hyperlipidemia Mother   . CAD Mother   . Hypertension Father   . Diabetes Sister   . CAD Maternal Grandmother   . Hypertension Maternal Grandmother   . Prostate cancer Maternal Grandfather   . Colon cancer Maternal Uncle    Social History  Substance Use Topics  . Smoking status: Former Games developer  . Smokeless tobacco: Never Used  . Alcohol Use: Yes     Comment: socially   OB History    No data available     Review of Systems:Negative except mentioned.  Allergies  Review of patient's allergies indicates no known allergies.  Home Medications   Prior to Admission medications   Medication Sig Start Date End Date Taking? Authorizing Provider  bisoprolol-hydrochlorothiazide (ZIAC) 2.5-6.25 MG per tablet Take 1  tablet by mouth daily.   Yes Historical Provider, MD  albuterol (PROVENTIL HFA;VENTOLIN HFA) 108 (90 BASE) MCG/ACT inhaler Inhale 2 puffs into the lungs every 6 (six) hours as needed for wheezing or shortness of breath.    Historical Provider, MD  aspirin 81 MG tablet Take 1 tablet (81 mg total) by mouth daily. 06/01/15   Gale Journey, MD  fluconazole (DIFLUCAN) 150 MG tablet Take one tablet now, and repeat in a week if needed Patient taking differently: Take 150 mg by mouth See admin instructions. Pt is to take one dose now and repeat in one week if necessary. 02/21/15   Rae Halsted, PA-C  naproxen sodium (ANAPROX) 220 MG tablet Take 440 mg by mouth 2 (two) times daily as needed (for pain/headache).    Historical Provider, MD   Meds Ordered and Administered this Visit  Medications - No data to display  BP 112/86 mmHg  Pulse 78  Temp(Src) 97.8 F (36.6 C) (Tympanic)  Resp 16  Ht  (1.727 m)  Wt 210 lb (95.255 kg)  BMI 31.94 kg/m2  SpO2 100%  LMP 02/23/2016 No data found.   Physical Exam:  GENERAL: NAD HEENT: mild pharyngeal erythema, no exudate, no erythema of TMs, no cervical LAD RESP: CTA B CARD: RRR   ED Course  Procedures (including critical care time)  Labs Review Labs Reviewed  RAPID STREP SCREEN (NOT AT  [facility])    Imaging Review No results found.     MDM  A/P: URI- Z-Pak, Delsym when necessary, Claritin when necessary, rest, hydration, seek medical attention if symptoms persist or worsen.    Jolene ProvostKirtida Kresta Templeman, MD 03/01/16 [phone]

## 2016-08-31 ENCOUNTER — Emergency Department
Admission: EM | Admit: 2016-08-31 | Discharge: 2016-08-31 | Disposition: A | Discharge status: Home / Self Care | Payer: BLUE CROSS/BLUE SHIELD | Attending: Emergency Medicine | Admitting: Emergency Medicine

## 2016-08-31 DIAGNOSIS — Z87891 Personal history of nicotine dependence: Secondary | ICD-10-CM | POA: Diagnosis not present

## 2016-08-31 DIAGNOSIS — J111 Influenza due to unidentified influenza virus with other respiratory manifestations: Secondary | ICD-10-CM | POA: Diagnosis not present

## 2016-08-31 DIAGNOSIS — R52 Pain, unspecified: Secondary | ICD-10-CM | POA: Diagnosis present

## 2016-08-31 DIAGNOSIS — Z7982 Long term (current) use of aspirin: Secondary | ICD-10-CM | POA: Diagnosis not present

## 2016-08-31 DIAGNOSIS — J449 Chronic obstructive pulmonary disease, unspecified: Secondary | ICD-10-CM | POA: Insufficient documentation

## 2016-08-31 DIAGNOSIS — I10 Essential (primary) hypertension: Secondary | ICD-10-CM | POA: Diagnosis not present

## 2016-08-31 DIAGNOSIS — Z791 Long term (current) use of non-steroidal anti-inflammatories (NSAID): Secondary | ICD-10-CM | POA: Diagnosis not present

## 2016-08-31 LAB — INFLUENZA PANEL BY PCR (TYPE A & B)
INFLBPCR: POSITIVE — AB
Influenza A By PCR: NEGATIVE

## 2016-08-31 MED ORDER — OSELTAMIVIR PHOSPHATE 75 MG PO CAPS: 75.0000 mg | ORAL_CAPSULE | Freq: Two times a day (BID) | ORAL | 0 refills | Status: AC

## 2016-08-31 MED ORDER — FLUTICASONE PROPIONATE 50 MCG/ACT NA SUSP: 2.0000 | Freq: Every day | NASAL | 0 refills | Status: AC

## 2016-08-31 MED ORDER — IBUPROFEN 800 MG PO TABS
800.0000 mg | ORAL_TABLET | Freq: Once | ORAL | Status: AC
  Administered 2016-08-31: 800 mg via ORAL
  Filled 2016-08-31: qty 1

## 2016-08-31 MED ORDER — BENZONATATE 100 MG PO CAPS: 100.0000 mg | ORAL_CAPSULE | Freq: Three times a day (TID) | ORAL | 0 refills | Status: AC | PRN

## 2016-08-31 NOTE — ED Triage Notes (Signed)
Pt was exposed to flu by youngest daughter.  Pt states symptoms started today. Pt states she took tylenol before coming to ER.

## 2016-08-31 NOTE — Discharge Instructions (Signed)
Monitor symptoms and hydrate. Take the Tamiflu as directed.

## 2016-08-31 NOTE — ED Notes (Signed)
See triage note  States she developed fever and body aches yesterday   States family member was dx'd with flu recently

## 2016-09-02 NOTE — ED Provider Notes (Signed)
[facility] Emergency Department Provider Note ____________________________________________  Time seen: [id number]  I have reviewed the triage vital signs and the nursing notes.  HISTORY  Chief Complaint  Influenza  HPI Grace Olson is a [age] y.o. female presents to the ED accompanied by her adult daughter for evaluation of symptomsof the flu. Patient describes she was exposed to the flu by her youngest daughter. She describes onset of symptoms today which primarily include body aches and headaches. Patient describes taken Tylenol prior to her arrival in the ED. She otherwise denies any significant cough, congestion, or fevers. She reports that she and her adult daughter who is also present in both received a flu vaccine just last week.  Past Medical History:  Diagnosis Date  . COPD (chronic obstructive pulmonary disease) (HCC)   . Hypertension   . Sarcoid (HCC)   . Stroke (HCC)   . UTI (lower urinary tract infection)     Patient Active Problem List   Diagnosis Date Noted  . CVA (cerebral infarction) 05/31/2015  . HTN (hypertension) 05/31/2015  . COPD (chronic obstructive pulmonary disease) (HCC) 05/31/2015  . Sarcoid (HCC) 05/31/2015  . H/O: stroke 05/31/2015    Past Surgical History:  Procedure Laterality Date  . BRONCHOSCOPY    . CESAREAN SECTION     2005    Prior to Admission medications   Medication Sig Start Date End Date Taking? Authorizing Provider  albuterol (PROVENTIL HFA;VENTOLIN HFA) 108 (90 BASE) MCG/ACT inhaler Inhale 2 puffs into the lungs every 6 (six) hours as needed for wheezing or shortness of breath.    Historical Provider, MD  aspirin 81 MG tablet Take 1 tablet (81 mg total) by mouth daily. 06/01/15   Gale Journeyatherine P Walsh, MD  azithromycin (ZITHROMAX Z-PAK) 250 MG tablet Use for 5 days as instructed on box. 03/01/16   Jolene ProvostKirtida Patel, MD  benzonatate (TESSALON PERLES) 100 MG capsule Take 1 capsule (100 mg total) by mouth 3 (three) times daily  as needed for cough (Take 1-2 per dose). 08/31/16   Travelle Mcclimans V Bacon [place], PA-C  bisoprolol-hydrochlorothiazide (ZIAC) 2.5-6.25 MG per tablet Take 1 tablet by mouth daily.    Historical Provider, MD  fluconazole (DIFLUCAN) 150 MG tablet Take one tablet now, and repeat in a week if needed Patient taking differently: Take 150 mg by mouth See admin instructions. Pt is to take one dose now and repeat in one week if necessary. 02/21/15   Rae HalstedLaurie W Lee, PA-C  fluticasone [facility](FLONASE) 50 MCG/ACT nasal spray Place 2 sprays into both nostrils daily. 08/31/16   Lyvonne Cassell V Bacon Irania Durell, PA-C  naproxen sodium (ANAPROX) 220 MG tablet Take 440 mg by mouth 2 (two) times daily as needed (for pain/headache).    Historical Provider, MD  oseltamivir (TAMIFLU) 75 MG capsule Take 1 capsule (75 mg total) by mouth 2 (two) times daily. 08/31/16 09/05/16  Charlesetta IvoryJenise V Bacon Daniella Dewberry, PA-C   Allergies Patient has no known allergies.  Family History  Problem Relation Age of Onset  . Heart failure Mother   . Hypertension Mother   . Stroke Mother   . Hyperlipidemia Mother   . CAD Mother   . Lung cancer Father   . Hypertension Father   . CAD Maternal Grandmother   . Hypertension Maternal Grandmother   . Prostate cancer Maternal Grandfather   . Diabetes Sister   . Colon cancer Maternal Uncle     Social History Social History  Substance Use Topics  . Smoking status: Former  Smoker  . Smokeless tobacco: Never Used  . Alcohol use Yes     Comment: socially    Review of Systems  Constitutional: Positive for fever. Eyes: Negative for visual changes. ENT: Negative for sore throat. Cardiovascular: Negative for chest pain. Respiratory: Negative for shortness of breath. Musculoskeletal: Negative for back pain. Reports generalized bodyaches. Skin: Negative for rash. Neurological: Negative for focal weakness or numbness. Reports headache. ____________________________________________  PHYSICAL EXAM:  VITAL SIGNS: ED  Triage Vitals  Enc Vitals Group     BP 08/31/16 0117 (!) 127/104     Pulse Rate 08/31/16 0117 (!) 101     Resp 08/31/16 0117 18     Temp 08/31/16 0117 98.6 F (37 C)     Temp Source 08/31/16 0548 Oral     SpO2 08/31/16 0117 97 %     Weight 08/31/16 0117 209 lb (94.8 kg)     Height 08/31/16 0117 5\' 8"  (1.727 m)     Head Circumference --      Peak Flow --      Pain Score 08/31/16 0118 4     Pain Loc --      Pain Edu? --      Excl. in GC? --    Constitutional: Alert and oriented. Well appearing and in no distress. Head: Normocephalic and atraumatic. Eyes: Conjunctivae are normal. PERRL. Normal extraocular movements Ears: Canals clear. TMs intact bilaterally. Nose: No congestion/rhinorrhea/epistaxis. Mouth/Throat: Mucous membranes are moist. Neck: Supple. No thyromegaly. Hematological/Lymphatic/Immunological: No cervical lymphadenopathy. Cardiovascular: Normal rate, regular rhythm. Normal distal pulses. Respiratory: Normal respiratory effort. No wheezes/rales/rhonchi. Musculoskeletal: Nontender with normal range of motion in all extremities.  Neurologic:  Normal gait without ataxia. Normal speech and language. No gross focal neurologic deficits are appreciated. Skin:  Skin is warm, dry and intact. No rash noted. ____________________________________________   LABS (pertinent positives/negatives) Labs Reviewed  INFLUENZA PANEL BY PCR (TYPE A & B, H1N1) - Abnormal; Notable for the following:       Result Value   Influenza B By PCR POSITIVE (*)    All other components within normal limits  ____________________________________________  PROCEDURES  IBU 800 mg PO ____________________________________________  INITIAL IMPRESSION / ASSESSMENT AND PLAN / ED COURSE  Patient with a laboratory-confirmed influenza B infection. She is discharged with a prescription for Tamiflu, Tessalon Perles, and Flonase. She is advised to continue monitoring treat fevers as appropriate. She'll hydrate,  rest, follow-up with primary care provider as needed. Work note is provided for 3 days as requested.  Clinical Course    ____________________________________________  FINAL CLINICAL IMPRESSION(S) / ED DIAGNOSES  Final diagnoses:  Influenza      Lissa HoardJenise V Bacon Olney Monier, PA-C 09/02/16 1814    Charlynne Panderavid Hsienta Yao, MD 09/03/16 1423

## 2016-11-04 IMAGING — CR DG CHEST 2V
2 series · 2 of 2 positions shown · non-contrast
Comparison: 09/12/2010

CLINICAL DATA: Numbness. Left arm and leg tingling for 9 hours.
Shortness of breath.

EXAM:
CHEST  2 VIEW

[chest pa]
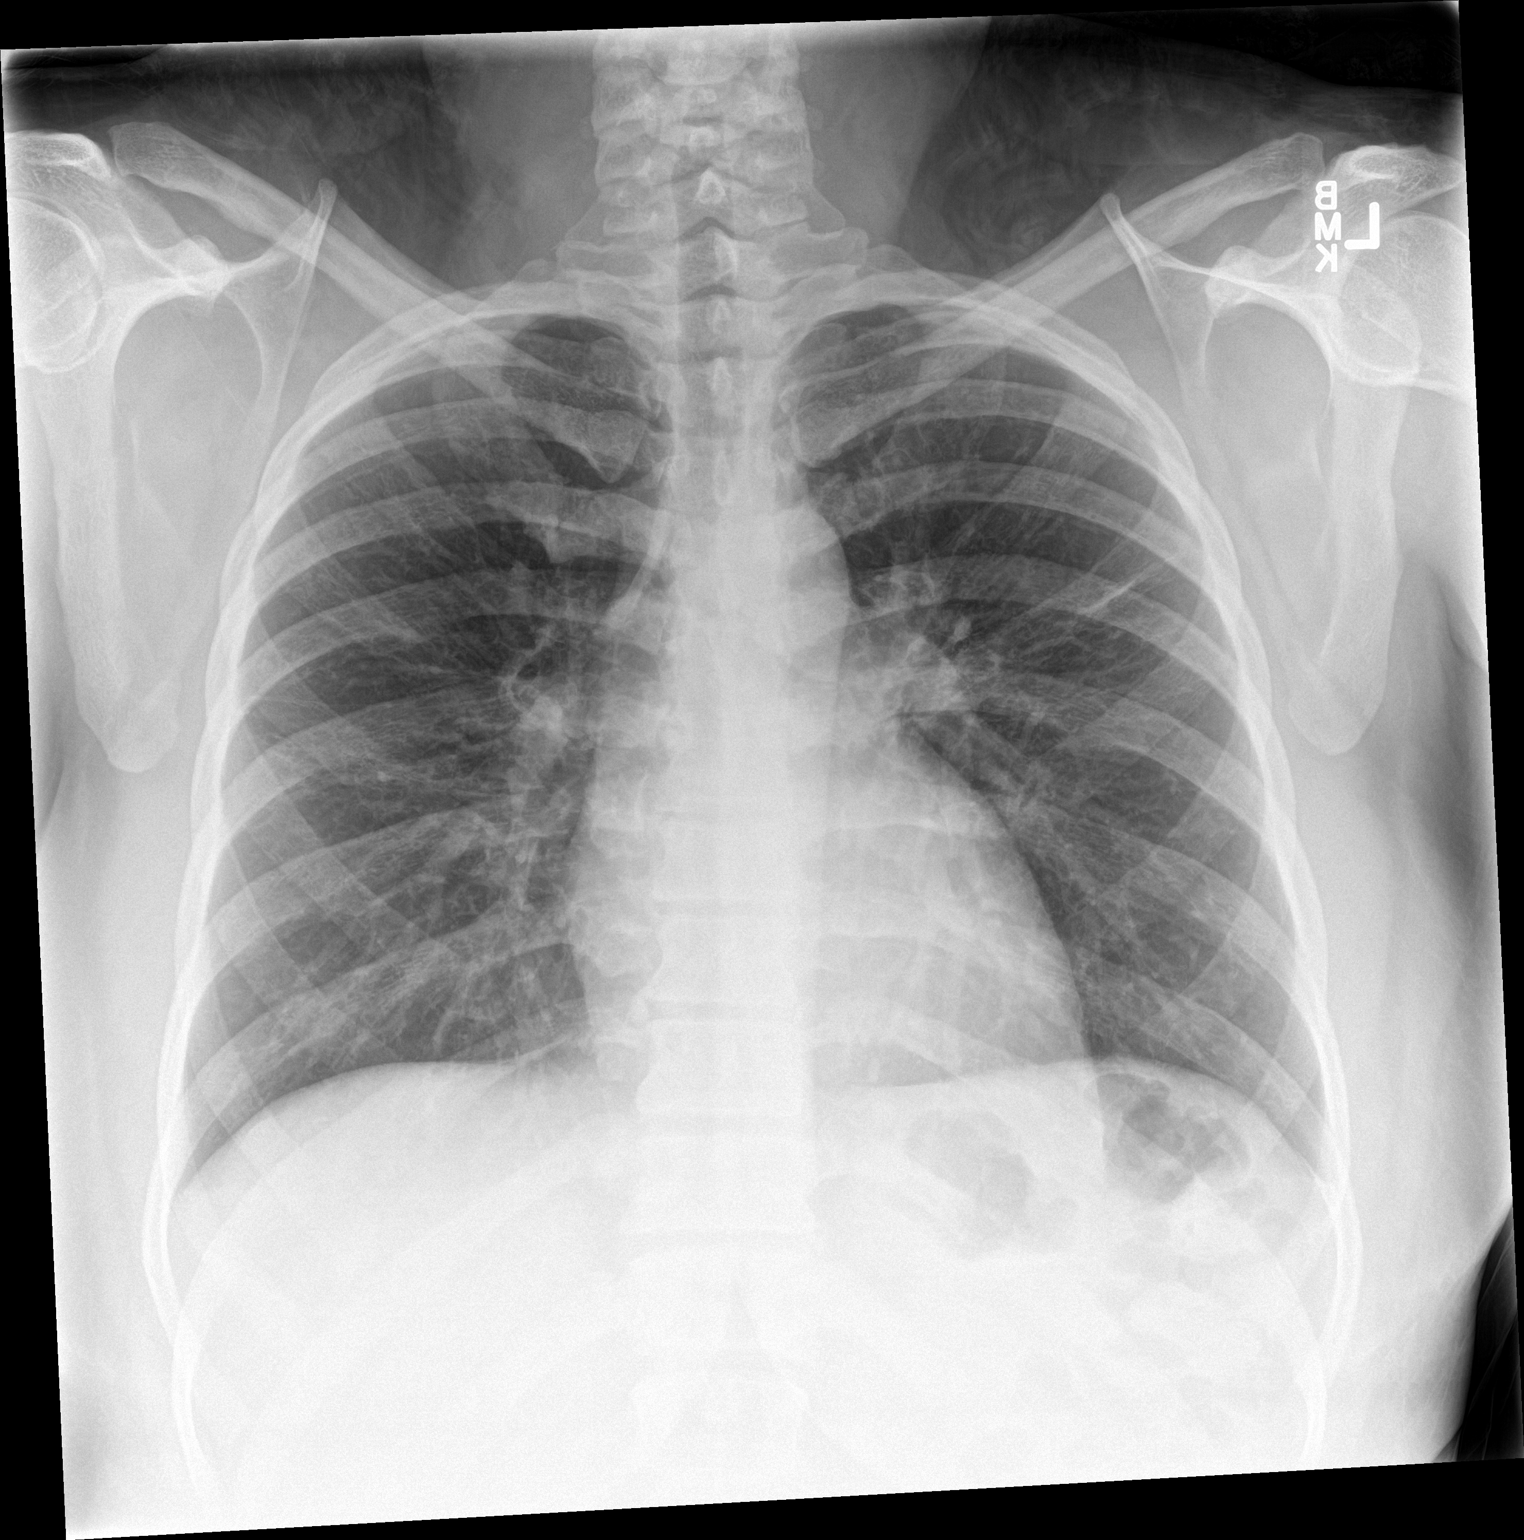

[chest lat]
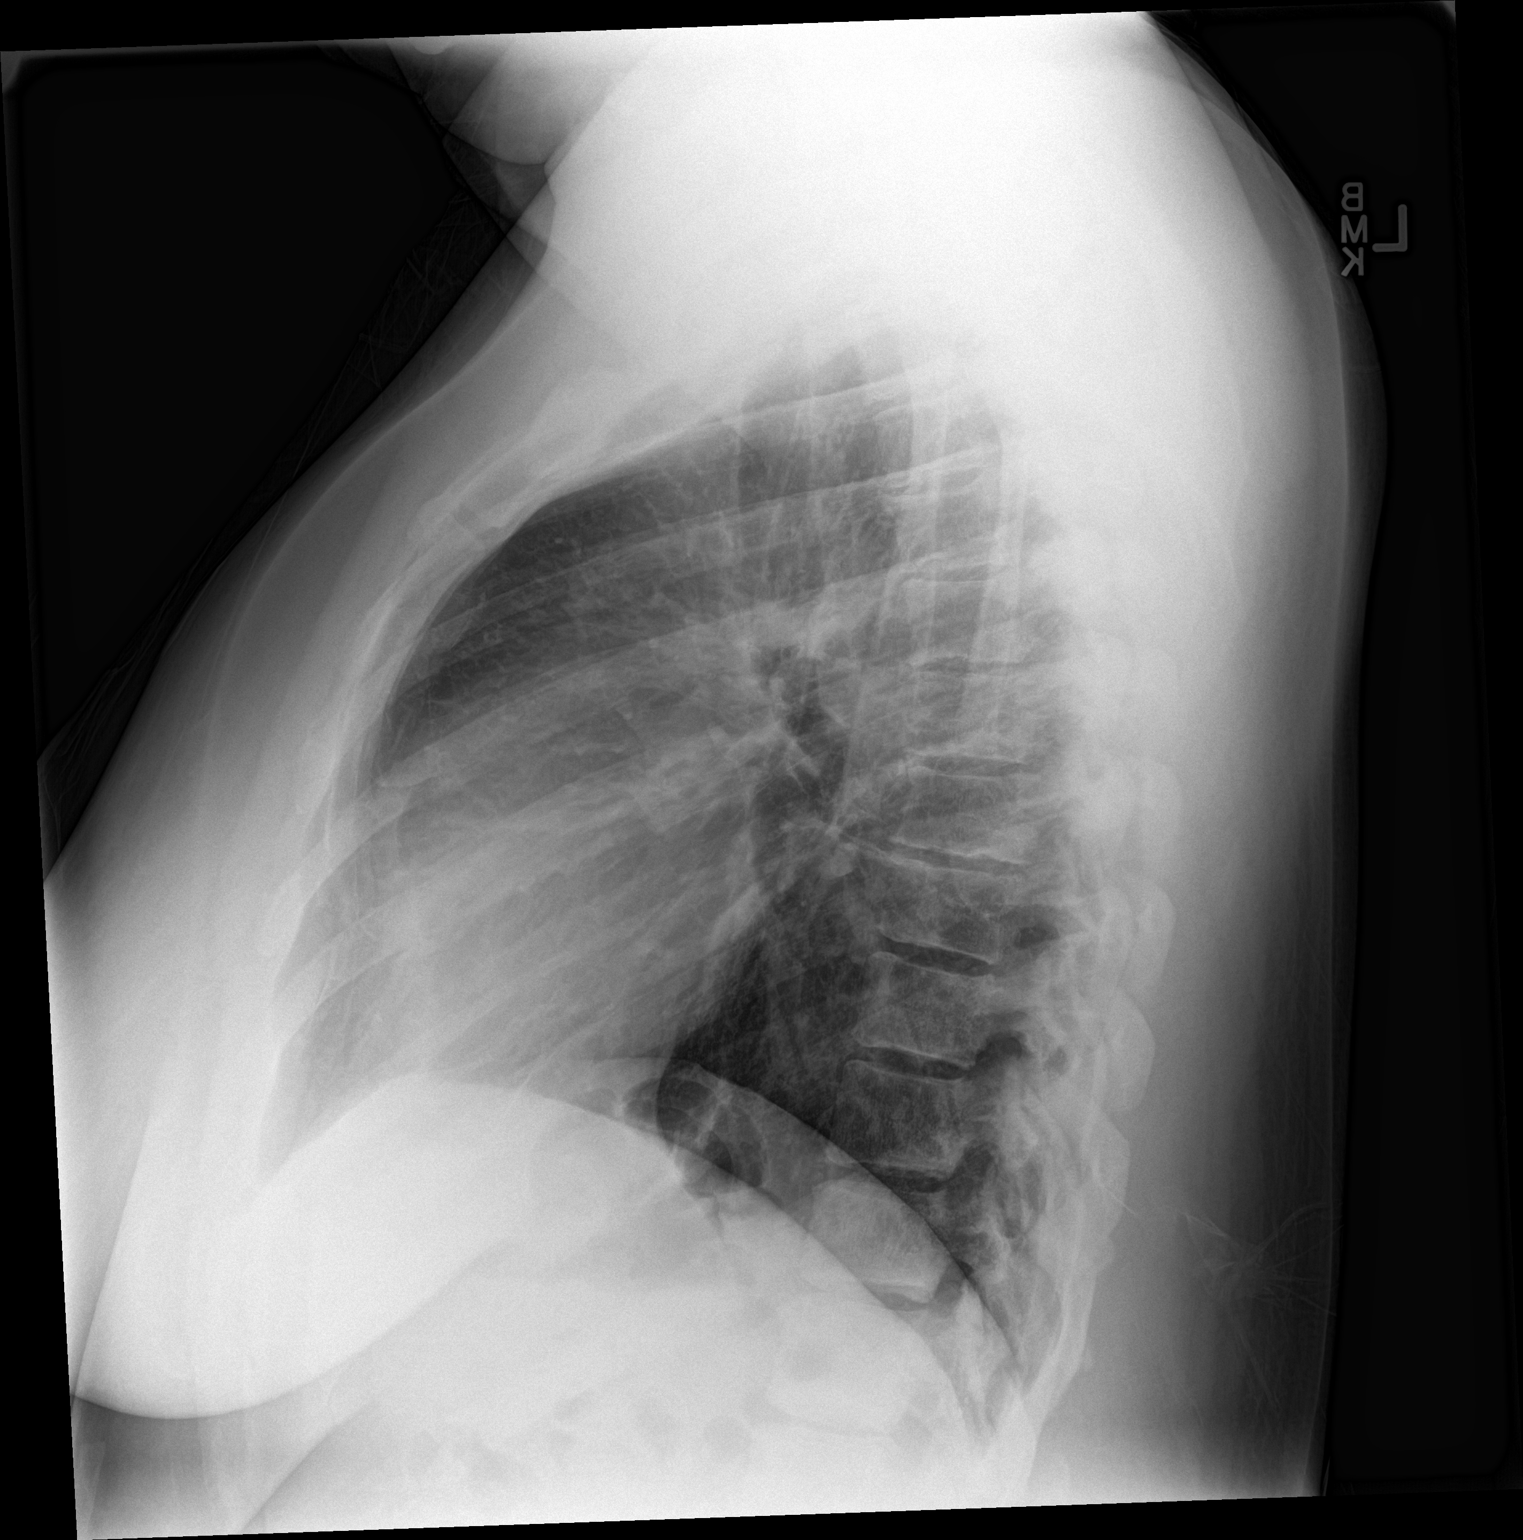

[2 of 2 positions shown; findings below may reference images not displayed]

FINDINGS: The cardiomediastinal contours are normal. Minimal linear
atelectasis or scarring in the left upper lung zone. Pulmonary
vasculature is normal. No consolidation, pleural effusion, or
pneumothorax. No acute osseous abnormalities are seen.
IMPRESSION: No acute pulmonary process.

## 2016-11-05 IMAGING — MR MR MRA HEAD W/O CM
1 series · 22 of 48 positions shown · IV contrast (multihance)
Comparison: None.

CLINICAL DATA: Hypertension. Episode of diaphoresis and
palpitations. Personal history of previous stroke.

EXAM:
MRI HEAD WITHOUT AND WITH CONTRAST
MRA HEAD WITHOUT CONTRAST
MRA NECK WITHOUT AND WITH CONTRAST
TECHNIQUE: Multiplanar, multiecho pulse sequences of the brain and surrounding
structures were obtained without and with intravenous contrast.
Angiographic images of the Circle of Willis were obtained using MRA
technique without intravenous contrast. Angiographic images of the
neck were obtained using MRA technique without and with intravenous
contrast. Carotid stenosis measurements (when applicable) are
obtained utilizing NASCET criteria, using the distal internal
carotid diameter as the denominator.
CONTRAST:  20mL MULTIHANCE GADOBENATE DIMEGLUMINE 529 MG/ML IV SOLN

[Series 3: TOF · axial · non-contrast · 0.7mm · 0.37mm/px · z∈[-58,+36]mm · 22 of 143 slices shown]
[im 1/143]
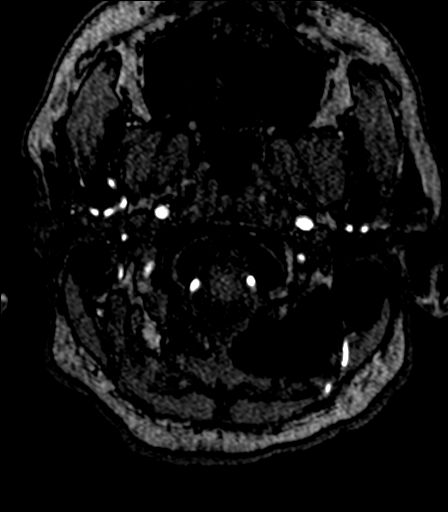
[im 4/143]
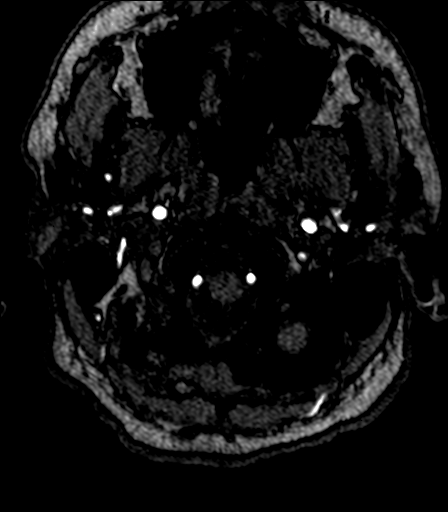
[im 7/143]
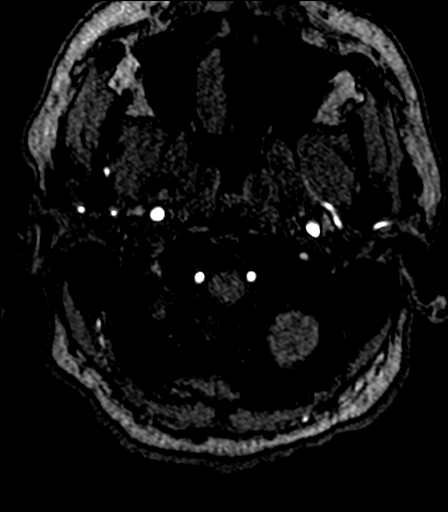
[im 10/143]
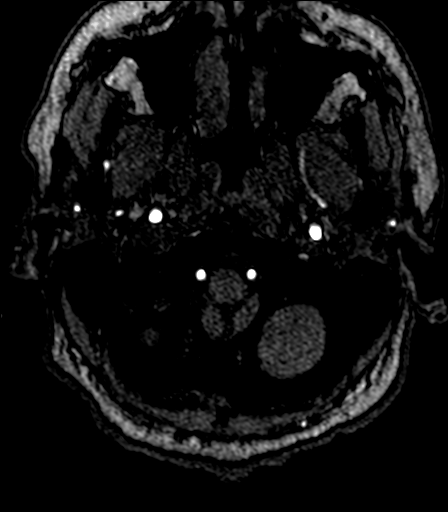
[im 13/143]
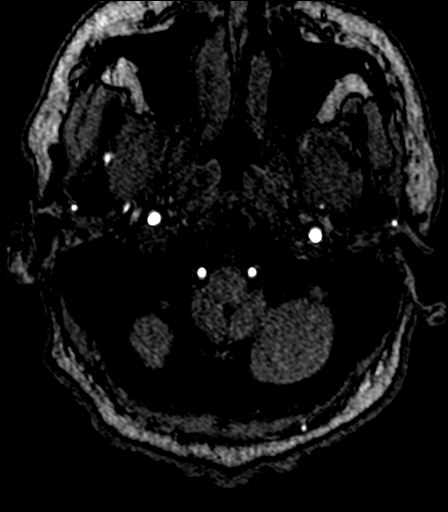
[im 16/143]
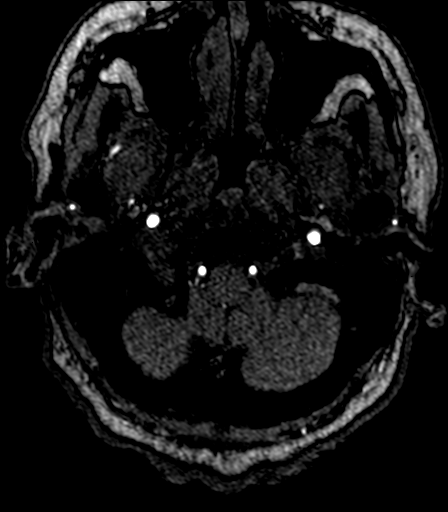
[im 19/143]
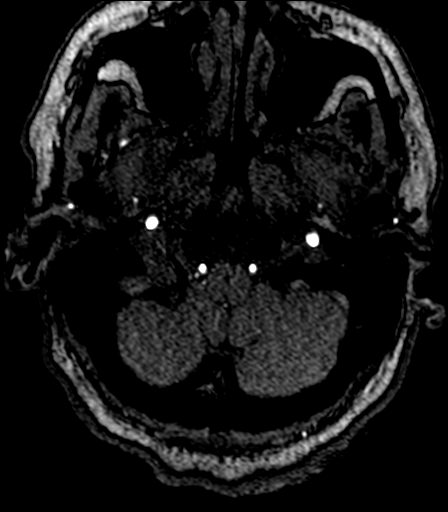
[im 22/143]
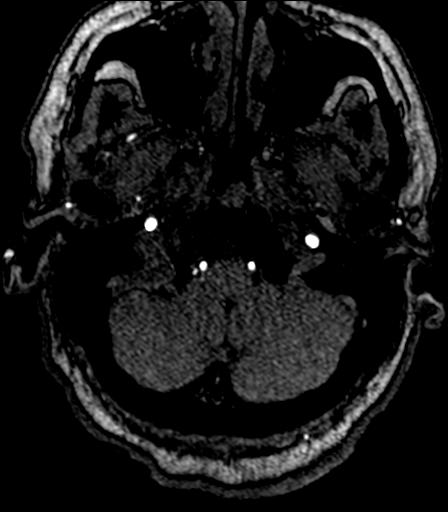
[im 25/143]
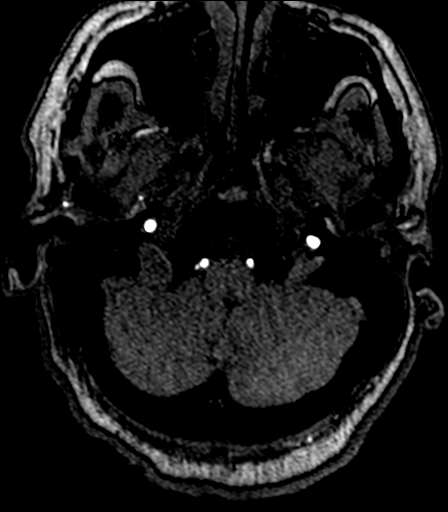
[im 28/143]
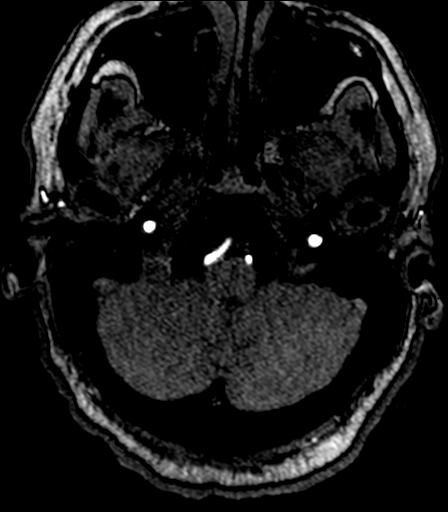
[im 31/143]
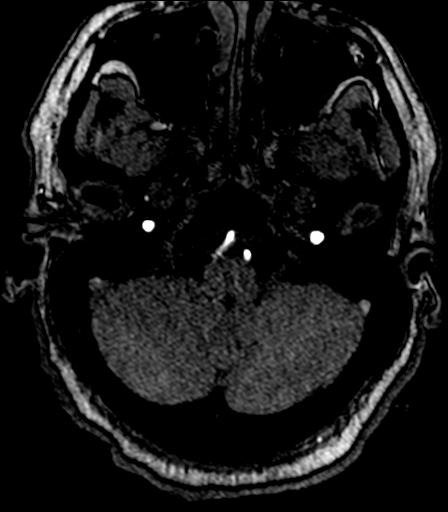
[im 34/143]
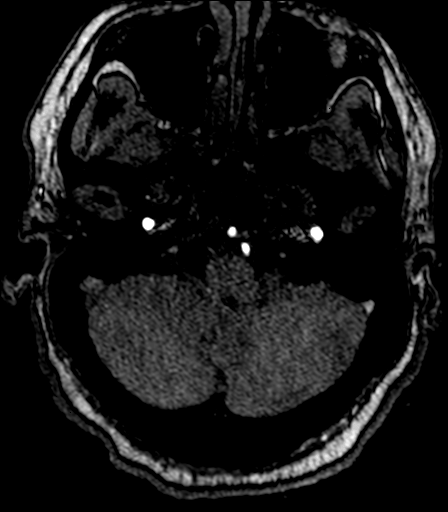
[im 37/143]
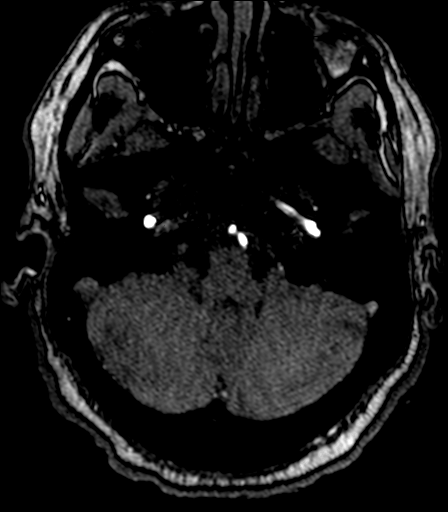
[im 40/143]
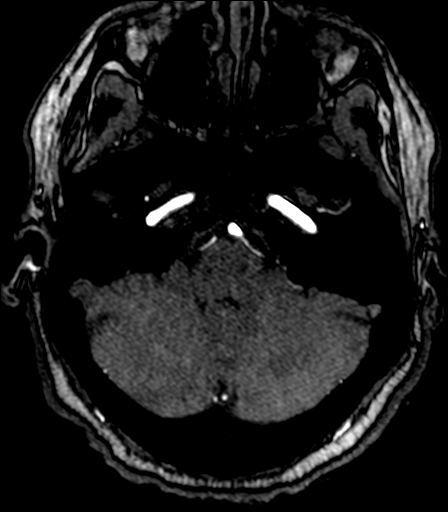
[im 46/143]
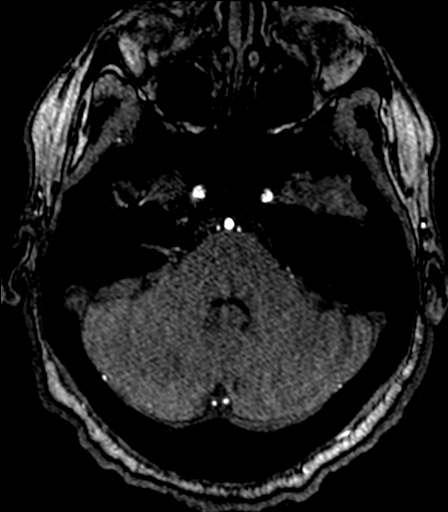
[im 64/143]
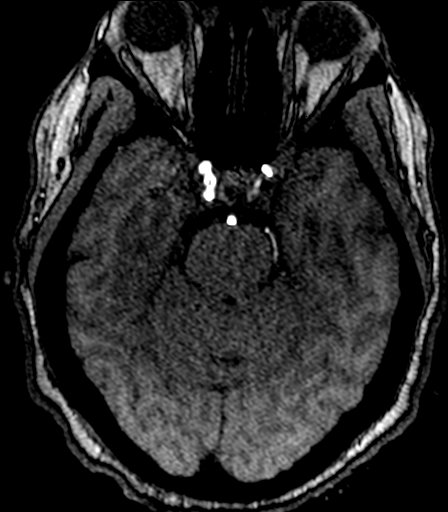
[im 73/143]
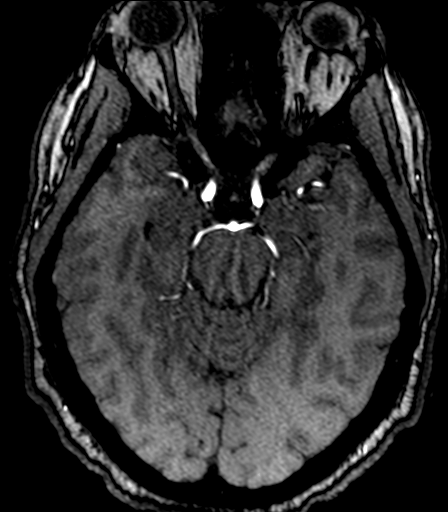
[im 82/143]
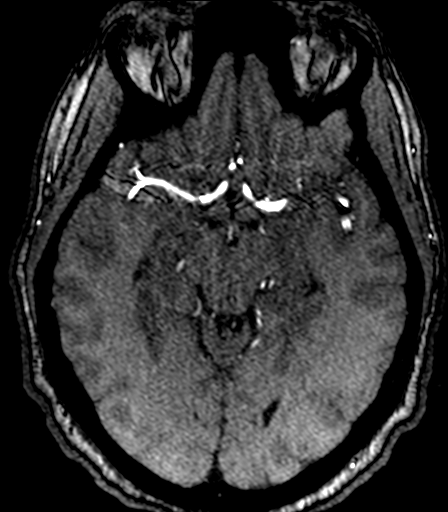
[im 100/143]
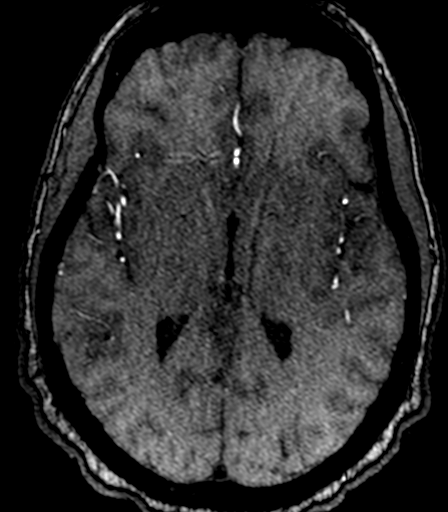
[im 118/143]
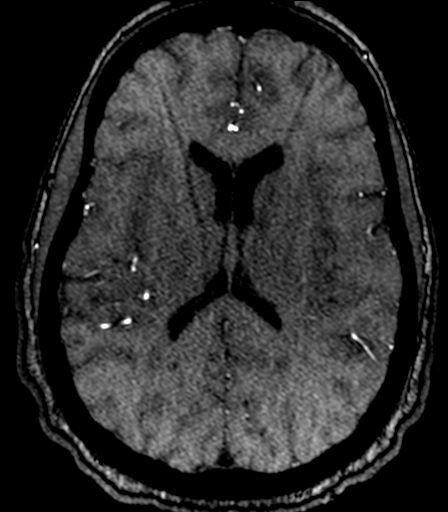
[im 121/143]
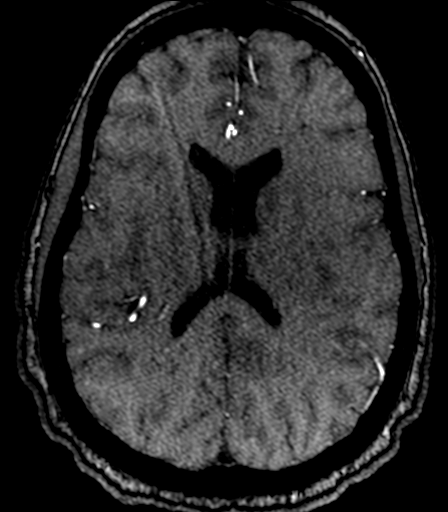
[im 136/143]
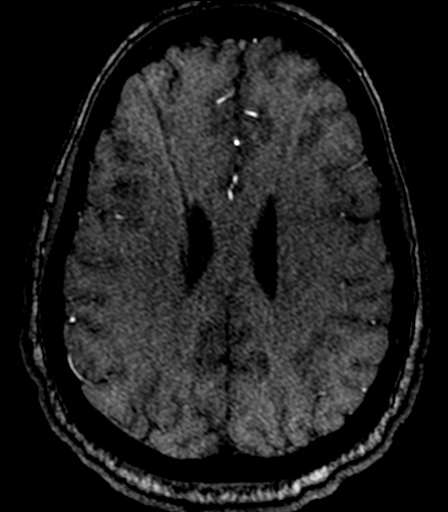

[22 of 48 positions shown; findings below may reference images not displayed]

FINDINGS: MRI HEAD FINDINGS

Diffusion imaging does not show any acute or subacute infarction.
The brainstem and cerebellum are normal. Within the cerebral
hemispheres, there are scattered foci of T2 and FLAIR signal
consistent with old small vessel infarctions. The differential
diagnosis is that of demyelinating disease. No cortical or large
vessel territory infarction. No mass lesion, hemorrhage,
hydrocephalus or extra-axial collection. No pituitary mass. No
inflammatory sinus disease. No skull or skullbase lesion.

MRA HEAD FINDINGS

Both internal carotid arteries are widely patent into the brain. No
siphon stenosis. The anterior and middle cerebral vessels are normal
without proximal stenosis, aneurysm or vascular malformation. Both
vertebral arteries are widely patent to the basilar. No basilar
stenosis. Posterior circulation branch vessels are normal.

MRA NECK FINDINGS

Branching pattern of the brachiocephalic vessels from the arch is
normal. No origin stenosis. Both common carotid arteries are widely
patent to the bifurcation. Both carotid bifurcations are normal
without stenosis or irregularity. Both cervical internal carotid
arteries are normal. Both vertebral arteries are widely patent at
their origins and through the cervical region.
IMPRESSION: Normal MR angiography of the neck vessels.

Normal intracranial MR angiography of the large and medium size
vessels.

No acute brain finding. Chronic appearing foci of T2 and FLAIR
signal in the cerebral hemispheric white matter most consistent with
old small vessel infarctions. Differential diagnosis does include
demyelinating disease, but that is felt considerably less likely.

## 2019-08-18 DIAGNOSIS — J310 Chronic rhinitis: Secondary | ICD-10-CM | POA: Diagnosis not present

## 2019-08-22 DIAGNOSIS — F4323 Adjustment disorder with mixed anxiety and depressed mood: Secondary | ICD-10-CM | POA: Diagnosis not present

## 2019-08-22 DIAGNOSIS — J479 Bronchiectasis, uncomplicated: Secondary | ICD-10-CM | POA: Diagnosis not present

## 2019-08-22 DIAGNOSIS — Z01818 Encounter for other preprocedural examination: Secondary | ICD-10-CM | POA: Diagnosis not present

## 2019-08-22 DIAGNOSIS — G4733 Obstructive sleep apnea (adult) (pediatric): Secondary | ICD-10-CM | POA: Diagnosis not present

## 2019-08-22 DIAGNOSIS — Z6835 Body mass index (BMI) 35.0-35.9, adult: Secondary | ICD-10-CM | POA: Diagnosis not present

## 2019-08-24 DIAGNOSIS — R0600 Dyspnea, unspecified: Secondary | ICD-10-CM | POA: Diagnosis not present

## 2019-08-29 DIAGNOSIS — I10 Essential (primary) hypertension: Secondary | ICD-10-CM | POA: Diagnosis not present

## 2019-08-29 DIAGNOSIS — D869 Sarcoidosis, unspecified: Secondary | ICD-10-CM | POA: Diagnosis not present

## 2019-08-29 DIAGNOSIS — F411 Generalized anxiety disorder: Secondary | ICD-10-CM | POA: Diagnosis not present

## 2019-08-29 DIAGNOSIS — J479 Bronchiectasis, uncomplicated: Secondary | ICD-10-CM | POA: Diagnosis not present

## 2019-09-11 DIAGNOSIS — Z630 Problems in relationship with spouse or partner: Secondary | ICD-10-CM | POA: Diagnosis not present

## 2019-09-11 DIAGNOSIS — F4323 Adjustment disorder with mixed anxiety and depressed mood: Secondary | ICD-10-CM | POA: Diagnosis not present

## 2019-09-20 DIAGNOSIS — G4733 Obstructive sleep apnea (adult) (pediatric): Secondary | ICD-10-CM | POA: Diagnosis not present

## 2019-09-25 DIAGNOSIS — Z630 Problems in relationship with spouse or partner: Secondary | ICD-10-CM | POA: Diagnosis not present

## 2019-09-25 DIAGNOSIS — F4323 Adjustment disorder with mixed anxiety and depressed mood: Secondary | ICD-10-CM | POA: Diagnosis not present

## 2019-10-13 DIAGNOSIS — R0981 Nasal congestion: Secondary | ICD-10-CM | POA: Diagnosis not present

## 2019-10-14 DIAGNOSIS — R0981 Nasal congestion: Secondary | ICD-10-CM | POA: Diagnosis not present

## 2019-10-21 DIAGNOSIS — G4733 Obstructive sleep apnea (adult) (pediatric): Secondary | ICD-10-CM | POA: Diagnosis not present

## 2019-10-24 DIAGNOSIS — B373 Candidiasis of vulva and vagina: Secondary | ICD-10-CM | POA: Diagnosis not present

## 2019-10-24 DIAGNOSIS — N898 Other specified noninflammatory disorders of vagina: Secondary | ICD-10-CM | POA: Diagnosis not present

## 2019-10-31 DIAGNOSIS — G4733 Obstructive sleep apnea (adult) (pediatric): Secondary | ICD-10-CM | POA: Diagnosis not present

## 2019-10-31 DIAGNOSIS — J479 Bronchiectasis, uncomplicated: Secondary | ICD-10-CM | POA: Diagnosis not present

## 2019-11-01 DIAGNOSIS — J343 Hypertrophy of nasal turbinates: Secondary | ICD-10-CM | POA: Diagnosis not present

## 2019-11-12 DIAGNOSIS — Z23 Encounter for immunization: Secondary | ICD-10-CM | POA: Diagnosis not present

## 2019-11-22 DIAGNOSIS — D860 Sarcoidosis of lung: Secondary | ICD-10-CM | POA: Diagnosis not present

## 2019-12-03 DIAGNOSIS — Z23 Encounter for immunization: Secondary | ICD-10-CM | POA: Diagnosis not present

## 2019-12-08 DIAGNOSIS — N898 Other specified noninflammatory disorders of vagina: Secondary | ICD-10-CM | POA: Diagnosis not present

## 2019-12-08 DIAGNOSIS — B001 Herpesviral vesicular dermatitis: Secondary | ICD-10-CM | POA: Diagnosis not present

## 2019-12-08 DIAGNOSIS — Z0000 Encounter for general adult medical examination without abnormal findings: Secondary | ICD-10-CM | POA: Diagnosis not present

## 2019-12-08 DIAGNOSIS — R399 Unspecified symptoms and signs involving the genitourinary system: Secondary | ICD-10-CM | POA: Diagnosis not present

## 2019-12-08 DIAGNOSIS — E559 Vitamin D deficiency, unspecified: Secondary | ICD-10-CM | POA: Diagnosis not present

## 2019-12-19 DIAGNOSIS — G4733 Obstructive sleep apnea (adult) (pediatric): Secondary | ICD-10-CM | POA: Diagnosis not present

## 2020-01-18 DIAGNOSIS — G4733 Obstructive sleep apnea (adult) (pediatric): Secondary | ICD-10-CM | POA: Diagnosis not present

## 2020-02-01 DIAGNOSIS — N898 Other specified noninflammatory disorders of vagina: Secondary | ICD-10-CM | POA: Diagnosis not present

## 2020-02-01 DIAGNOSIS — Z01419 Encounter for gynecological examination (general) (routine) without abnormal findings: Secondary | ICD-10-CM | POA: Diagnosis not present

## 2020-02-18 DIAGNOSIS — G4733 Obstructive sleep apnea (adult) (pediatric): Secondary | ICD-10-CM | POA: Diagnosis not present

## 2020-03-19 DIAGNOSIS — N879 Dysplasia of cervix uteri, unspecified: Secondary | ICD-10-CM | POA: Diagnosis not present

## 2020-03-19 DIAGNOSIS — Z3202 Encounter for pregnancy test, result negative: Secondary | ICD-10-CM | POA: Diagnosis not present

## 2020-03-19 DIAGNOSIS — R87610 Atypical squamous cells of undetermined significance on cytologic smear of cervix (ASC-US): Secondary | ICD-10-CM | POA: Diagnosis not present

## 2020-03-19 DIAGNOSIS — G4733 Obstructive sleep apnea (adult) (pediatric): Secondary | ICD-10-CM | POA: Diagnosis not present

## 2020-03-19 DIAGNOSIS — B977 Papillomavirus as the cause of diseases classified elsewhere: Secondary | ICD-10-CM | POA: Diagnosis not present

## 2020-04-19 DIAGNOSIS — G4733 Obstructive sleep apnea (adult) (pediatric): Secondary | ICD-10-CM | POA: Diagnosis not present

## 2020-05-20 DIAGNOSIS — G4733 Obstructive sleep apnea (adult) (pediatric): Secondary | ICD-10-CM | POA: Diagnosis not present

## 2020-06-10 DIAGNOSIS — Z635 Disruption of family by separation and divorce: Secondary | ICD-10-CM | POA: Diagnosis not present

## 2020-06-10 DIAGNOSIS — F4322 Adjustment disorder with anxiety: Secondary | ICD-10-CM | POA: Diagnosis not present

## 2020-06-20 DIAGNOSIS — Z20828 Contact with and (suspected) exposure to other viral communicable diseases: Secondary | ICD-10-CM | POA: Diagnosis not present

## 2020-07-02 DIAGNOSIS — D860 Sarcoidosis of lung: Secondary | ICD-10-CM | POA: Diagnosis not present

## 2020-07-02 DIAGNOSIS — Z6835 Body mass index (BMI) 35.0-35.9, adult: Secondary | ICD-10-CM | POA: Diagnosis not present

## 2020-11-11 DIAGNOSIS — Z3202 Encounter for pregnancy test, result negative: Secondary | ICD-10-CM | POA: Diagnosis not present

## 2020-11-11 DIAGNOSIS — N926 Irregular menstruation, unspecified: Secondary | ICD-10-CM | POA: Diagnosis not present

## 2020-11-22 DIAGNOSIS — Z3169 Encounter for other general counseling and advice on procreation: Secondary | ICD-10-CM | POA: Diagnosis not present

## 2020-11-22 DIAGNOSIS — N915 Oligomenorrhea, unspecified: Secondary | ICD-10-CM | POA: Diagnosis not present

## 2020-12-02 DIAGNOSIS — N915 Oligomenorrhea, unspecified: Secondary | ICD-10-CM | POA: Diagnosis not present

## 2021-01-17 DIAGNOSIS — N898 Other specified noninflammatory disorders of vagina: Secondary | ICD-10-CM | POA: Diagnosis not present

## 2021-01-23 DIAGNOSIS — D860 Sarcoidosis of lung: Secondary | ICD-10-CM | POA: Diagnosis not present

## 2021-01-28 DIAGNOSIS — M4802 Spinal stenosis, cervical region: Secondary | ICD-10-CM | POA: Diagnosis not present

## 2021-01-28 DIAGNOSIS — M542 Cervicalgia: Secondary | ICD-10-CM | POA: Diagnosis not present

## 2021-03-27 DIAGNOSIS — R197 Diarrhea, unspecified: Secondary | ICD-10-CM | POA: Diagnosis not present

## 2021-03-28 DIAGNOSIS — R197 Diarrhea, unspecified: Secondary | ICD-10-CM | POA: Diagnosis not present

## 2021-05-08 DIAGNOSIS — N898 Other specified noninflammatory disorders of vagina: Secondary | ICD-10-CM | POA: Diagnosis not present

## 2021-05-08 DIAGNOSIS — R399 Unspecified symptoms and signs involving the genitourinary system: Secondary | ICD-10-CM | POA: Diagnosis not present

## 2021-05-08 DIAGNOSIS — B373 Candidiasis of vulva and vagina: Secondary | ICD-10-CM | POA: Diagnosis not present

## 2021-05-08 DIAGNOSIS — Z3201 Encounter for pregnancy test, result positive: Secondary | ICD-10-CM | POA: Diagnosis not present

## 2021-05-12 ENCOUNTER — Other Ambulatory Visit: Payer: Self-pay | Admitting: Family Medicine

## 2021-05-12 DIAGNOSIS — R319 Hematuria, unspecified: Secondary | ICD-10-CM

## 2021-05-15 ENCOUNTER — Ambulatory Visit: Payer: BC Managed Care – PPO

## 2021-05-20 DIAGNOSIS — Z01419 Encounter for gynecological examination (general) (routine) without abnormal findings: Secondary | ICD-10-CM | POA: Diagnosis not present

## 2021-05-20 DIAGNOSIS — N939 Abnormal uterine and vaginal bleeding, unspecified: Secondary | ICD-10-CM | POA: Diagnosis not present

## 2021-05-20 DIAGNOSIS — N926 Irregular menstruation, unspecified: Secondary | ICD-10-CM | POA: Diagnosis not present

## 2021-05-21 ENCOUNTER — Ambulatory Visit
Admission: RE | Admit: 2021-05-21 | Discharge: 2021-05-21 | Disposition: A | Discharge status: Home / Self Care | Payer: BC Managed Care – PPO | Source: Ambulatory Visit | Attending: Family Medicine | Admitting: Family Medicine

## 2021-05-21 ENCOUNTER — Other Ambulatory Visit: Payer: Self-pay

## 2021-05-21 DIAGNOSIS — R319 Hematuria, unspecified: Secondary | ICD-10-CM | POA: Diagnosis not present

## 2021-06-02 DIAGNOSIS — R300 Dysuria: Secondary | ICD-10-CM | POA: Diagnosis not present

## 2021-06-02 DIAGNOSIS — R310 Gross hematuria: Secondary | ICD-10-CM | POA: Diagnosis not present

## 2021-06-02 DIAGNOSIS — R301 Vesical tenesmus: Secondary | ICD-10-CM | POA: Diagnosis not present

## 2021-06-05 DIAGNOSIS — R102 Pelvic and perineal pain: Secondary | ICD-10-CM | POA: Diagnosis not present

## 2021-06-05 DIAGNOSIS — K5900 Constipation, unspecified: Secondary | ICD-10-CM | POA: Diagnosis not present

## 2021-06-06 DIAGNOSIS — R310 Gross hematuria: Secondary | ICD-10-CM | POA: Diagnosis not present

## 2021-06-06 DIAGNOSIS — R319 Hematuria, unspecified: Secondary | ICD-10-CM | POA: Diagnosis not present

## 2021-06-10 DIAGNOSIS — R319 Hematuria, unspecified: Secondary | ICD-10-CM | POA: Diagnosis not present

## 2021-06-11 DIAGNOSIS — Z20822 Contact with and (suspected) exposure to covid-19: Secondary | ICD-10-CM | POA: Diagnosis not present

## 2021-06-12 DIAGNOSIS — Z20822 Contact with and (suspected) exposure to covid-19: Secondary | ICD-10-CM | POA: Diagnosis not present

## 2021-06-12 DIAGNOSIS — Z03818 Encounter for observation for suspected exposure to other biological agents ruled out: Secondary | ICD-10-CM | POA: Diagnosis not present

## 2021-06-12 DIAGNOSIS — I10 Essential (primary) hypertension: Secondary | ICD-10-CM | POA: Diagnosis not present

## 2021-06-12 DIAGNOSIS — R0981 Nasal congestion: Secondary | ICD-10-CM | POA: Diagnosis not present

## 2021-06-12 DIAGNOSIS — R102 Pelvic and perineal pain: Secondary | ICD-10-CM | POA: Diagnosis not present

## 2021-06-12 DIAGNOSIS — D259 Leiomyoma of uterus, unspecified: Secondary | ICD-10-CM | POA: Diagnosis not present

## 2021-06-12 DIAGNOSIS — N921 Excessive and frequent menstruation with irregular cycle: Secondary | ICD-10-CM | POA: Diagnosis not present

## 2021-07-11 DIAGNOSIS — R319 Hematuria, unspecified: Secondary | ICD-10-CM | POA: Diagnosis not present

## 2021-07-29 DIAGNOSIS — N898 Other specified noninflammatory disorders of vagina: Secondary | ICD-10-CM | POA: Diagnosis not present

## 2021-07-29 DIAGNOSIS — Z113 Encounter for screening for infections with a predominantly sexual mode of transmission: Secondary | ICD-10-CM | POA: Diagnosis not present

## 2021-08-01 DIAGNOSIS — N898 Other specified noninflammatory disorders of vagina: Secondary | ICD-10-CM | POA: Diagnosis not present

## 2021-08-19 DIAGNOSIS — N926 Irregular menstruation, unspecified: Secondary | ICD-10-CM | POA: Diagnosis not present

## 2021-08-19 DIAGNOSIS — N898 Other specified noninflammatory disorders of vagina: Secondary | ICD-10-CM | POA: Diagnosis not present

## 2021-10-02 DIAGNOSIS — Z130 Encounter for screening for diseases of the blood and blood-forming organs and certain disorders involving the immune mechanism: Secondary | ICD-10-CM | POA: Diagnosis not present

## 2021-10-02 DIAGNOSIS — Z8249 Family history of ischemic heart disease and other diseases of the circulatory system: Secondary | ICD-10-CM | POA: Diagnosis not present

## 2021-10-02 DIAGNOSIS — Z131 Encounter for screening for diabetes mellitus: Secondary | ICD-10-CM | POA: Diagnosis not present

## 2021-10-02 DIAGNOSIS — Z0000 Encounter for general adult medical examination without abnormal findings: Secondary | ICD-10-CM | POA: Diagnosis not present

## 2021-10-02 DIAGNOSIS — E559 Vitamin D deficiency, unspecified: Secondary | ICD-10-CM | POA: Diagnosis not present

## 2021-10-20 ENCOUNTER — Other Ambulatory Visit: Payer: Self-pay | Admitting: Family Medicine

## 2021-10-20 DIAGNOSIS — Z1231 Encounter for screening mammogram for malignant neoplasm of breast: Secondary | ICD-10-CM

## 2021-10-22 ENCOUNTER — Ambulatory Visit
Admission: RE | Admit: 2021-10-22 | Discharge: 2021-10-22 | Disposition: A | Discharge status: Home / Self Care | Payer: BC Managed Care – PPO | Source: Ambulatory Visit | Attending: Family Medicine | Admitting: Family Medicine

## 2021-10-22 ENCOUNTER — Other Ambulatory Visit: Payer: Self-pay

## 2021-10-22 DIAGNOSIS — Z1231 Encounter for screening mammogram for malignant neoplasm of breast: Secondary | ICD-10-CM | POA: Insufficient documentation

## 2021-10-23 ENCOUNTER — Other Ambulatory Visit: Payer: Self-pay | Admitting: *Deleted

## 2021-10-23 ENCOUNTER — Inpatient Hospital Stay
Admission: RE | Admit: 2021-10-23 | Discharge: 2021-10-23 | Disposition: A | Discharge status: Home / Self Care | Payer: Self-pay | Source: Ambulatory Visit | Attending: *Deleted | Admitting: *Deleted

## 2021-10-23 DIAGNOSIS — Z1231 Encounter for screening mammogram for malignant neoplasm of breast: Secondary | ICD-10-CM

## 2021-11-12 DIAGNOSIS — K5730 Diverticulosis of large intestine without perforation or abscess without bleeding: Secondary | ICD-10-CM | POA: Diagnosis not present

## 2021-11-12 DIAGNOSIS — I10 Essential (primary) hypertension: Secondary | ICD-10-CM | POA: Diagnosis not present

## 2021-11-12 DIAGNOSIS — Z1211 Encounter for screening for malignant neoplasm of colon: Secondary | ICD-10-CM | POA: Diagnosis not present

## 2022-01-23 DIAGNOSIS — S0081XA Abrasion of other part of head, initial encounter: Secondary | ICD-10-CM | POA: Diagnosis not present

## 2022-02-11 DIAGNOSIS — D860 Sarcoidosis of lung: Secondary | ICD-10-CM | POA: Diagnosis not present

## 2022-02-11 DIAGNOSIS — G4733 Obstructive sleep apnea (adult) (pediatric): Secondary | ICD-10-CM | POA: Diagnosis not present

## 2022-03-25 DIAGNOSIS — B9689 Other specified bacterial agents as the cause of diseases classified elsewhere: Secondary | ICD-10-CM | POA: Diagnosis not present

## 2022-03-25 DIAGNOSIS — J329 Chronic sinusitis, unspecified: Secondary | ICD-10-CM | POA: Diagnosis not present

## 2022-05-22 DIAGNOSIS — Z01419 Encounter for gynecological examination (general) (routine) without abnormal findings: Secondary | ICD-10-CM | POA: Diagnosis not present

## 2022-06-17 DIAGNOSIS — R0602 Shortness of breath: Secondary | ICD-10-CM | POA: Diagnosis not present

## 2022-09-08 ENCOUNTER — Encounter: Payer: Self-pay | Admitting: Student

## 2022-10-26 IMAGING — US US RENAL
1 series · 14 of 25 positions shown · non-contrast
Comparison: None.

CLINICAL DATA: Hematuria, unspecified type

EXAM:
RENAL / URINARY TRACT ULTRASOUND COMPLETE

[Series 1: us renal · 0.23mm/px · 30 acquisitions, 14 frames shown]
[im 1/30]
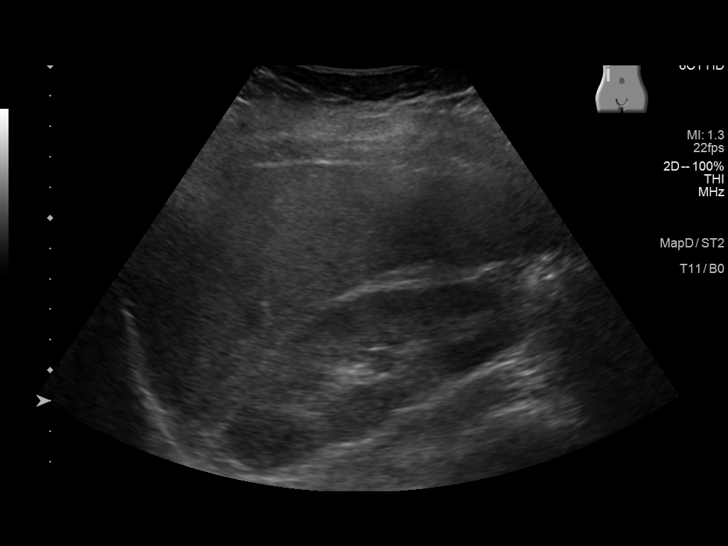
[im 3/30]
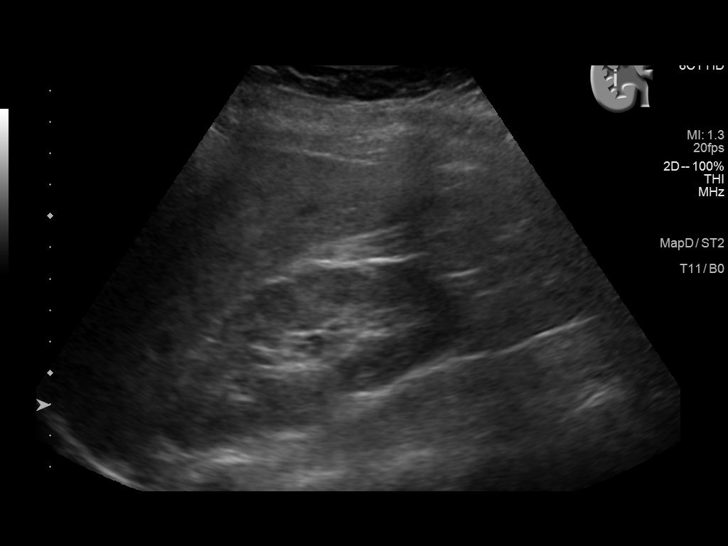
[im 5/30]
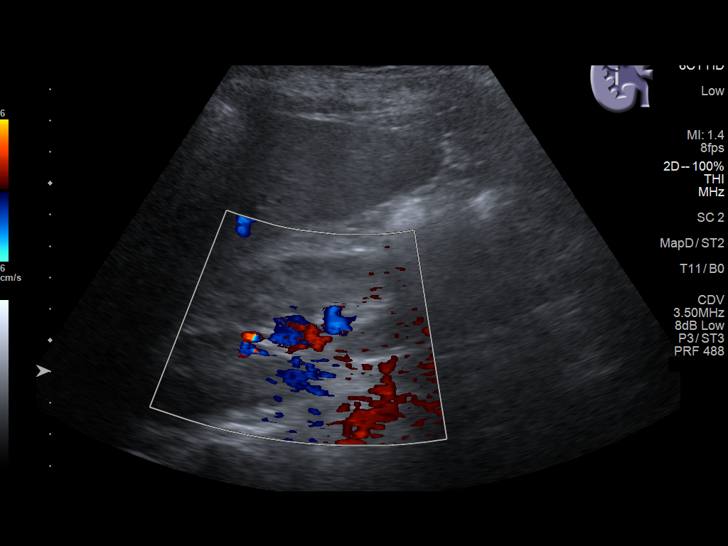
[im 8/30]
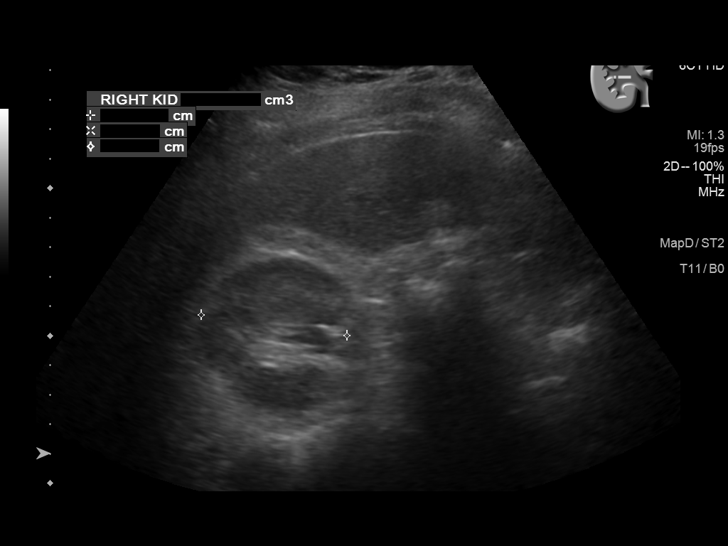
[im 10/30]
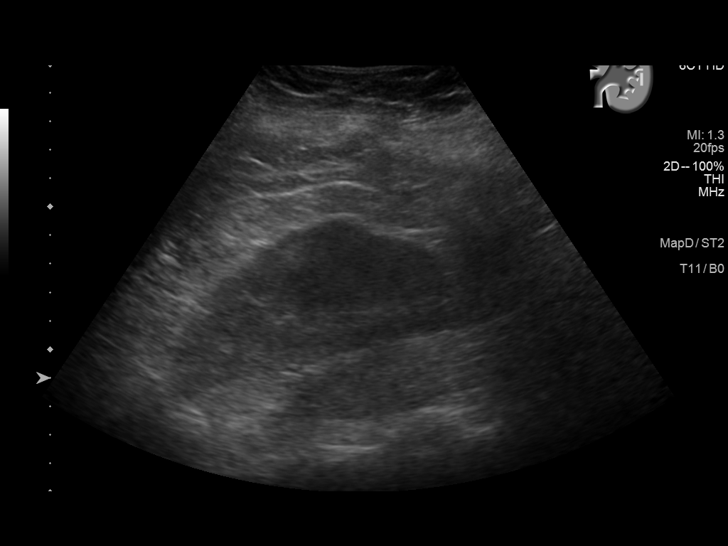
[im 11/30]
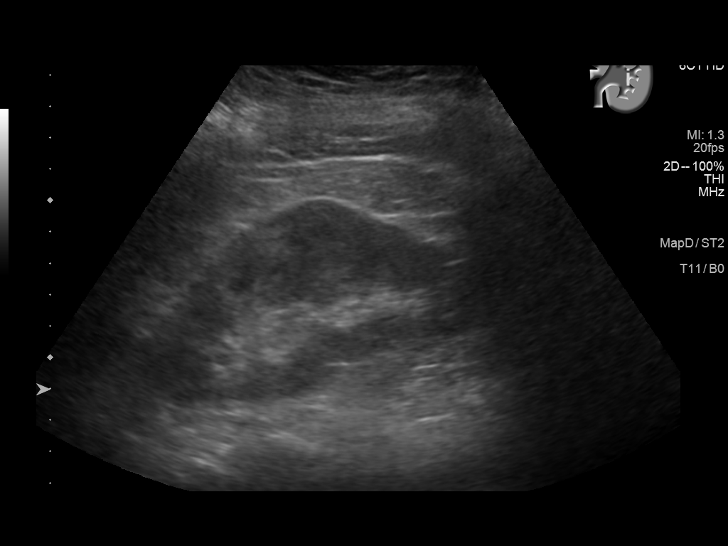
[im 14/30]
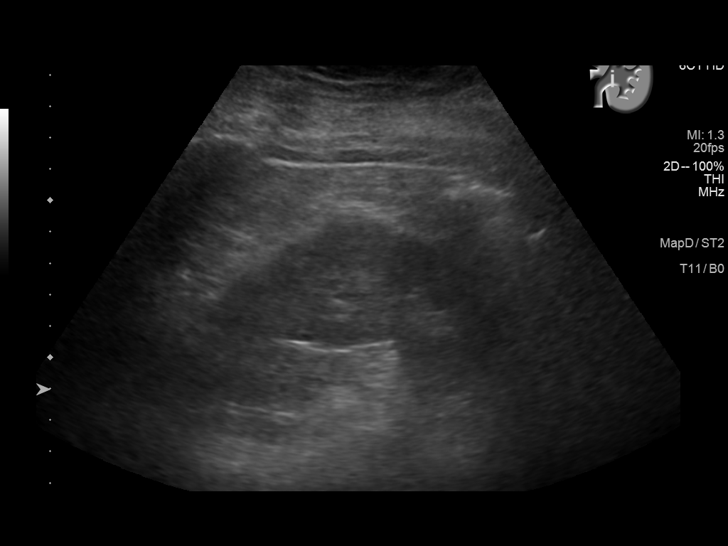
[im 16/30]
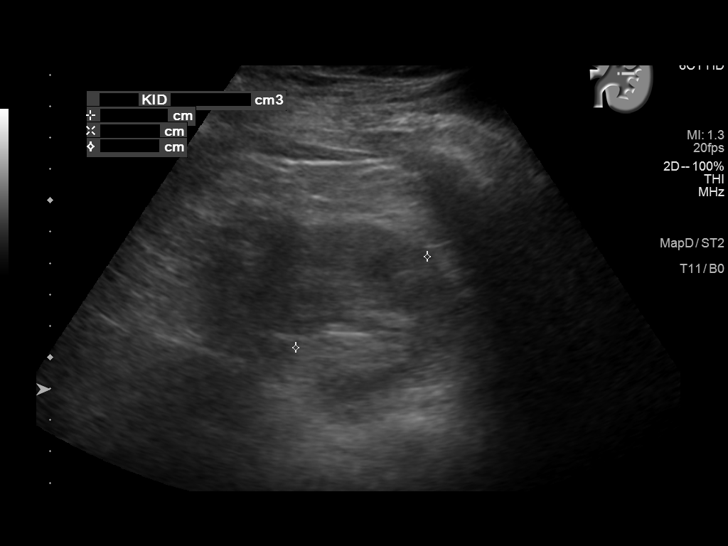
[im 19/30]
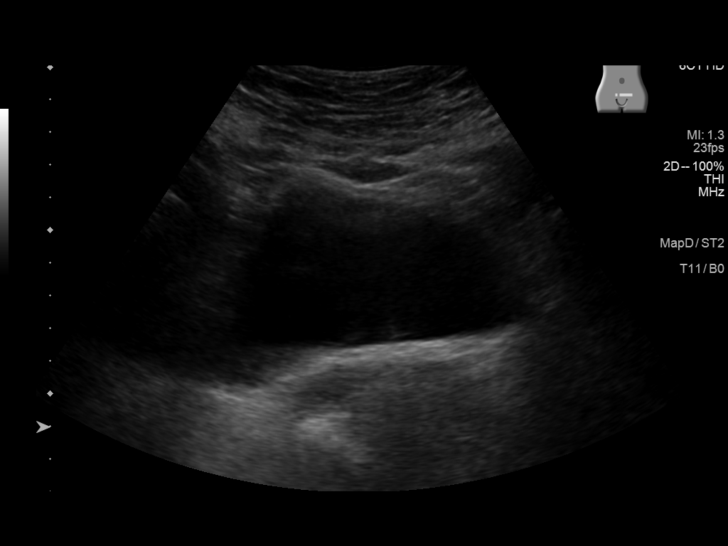
[im 20/30]
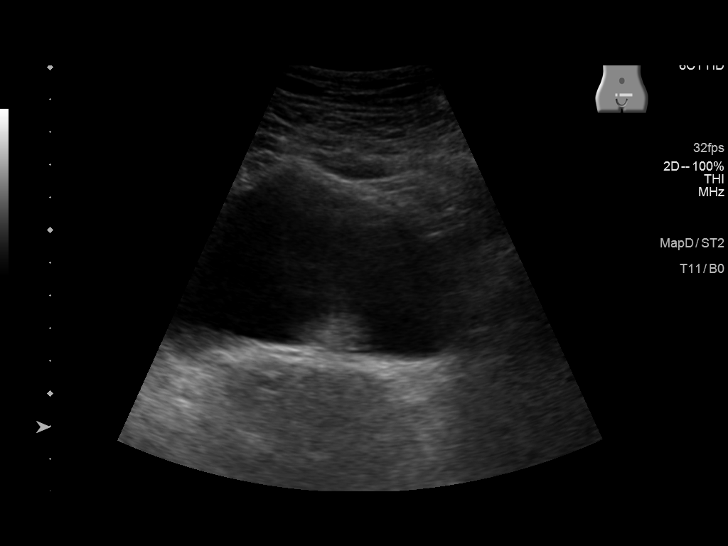
[im 22/30]
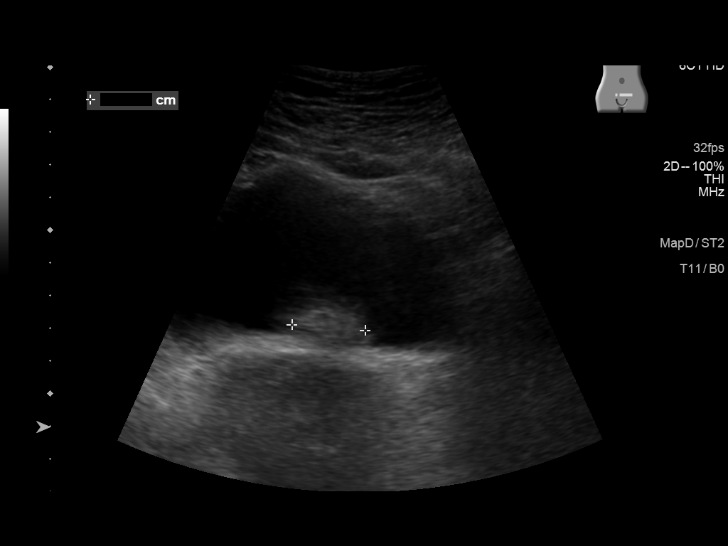
[im 25/30]
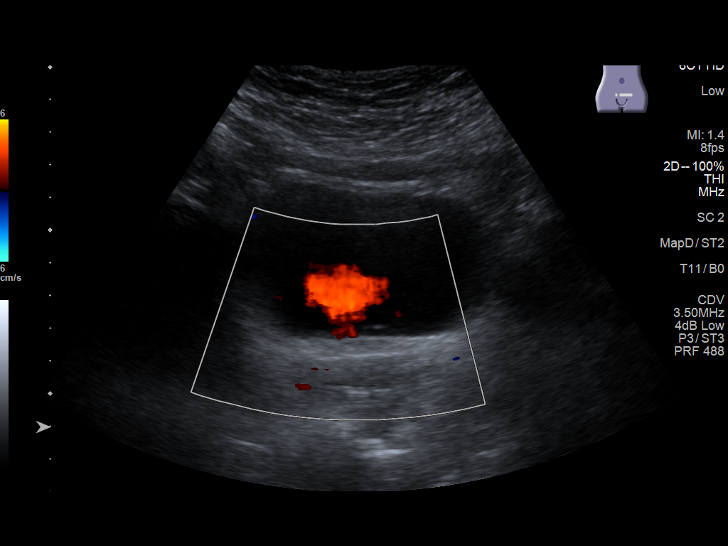
[im 27/30]
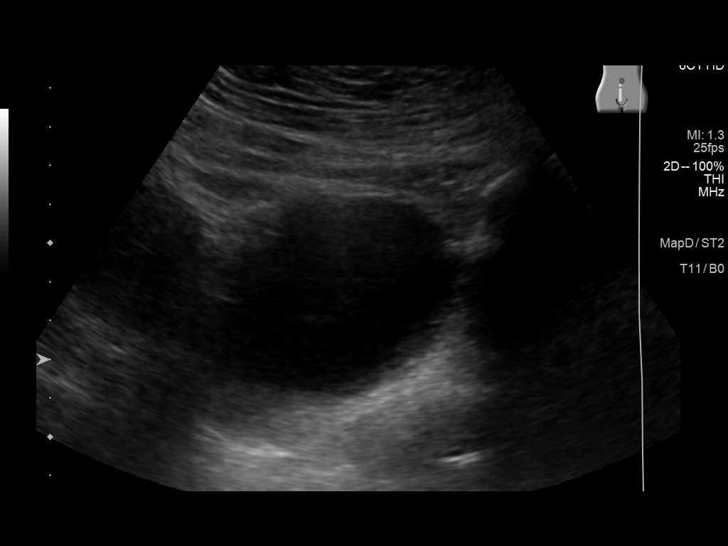
[im 30/30]
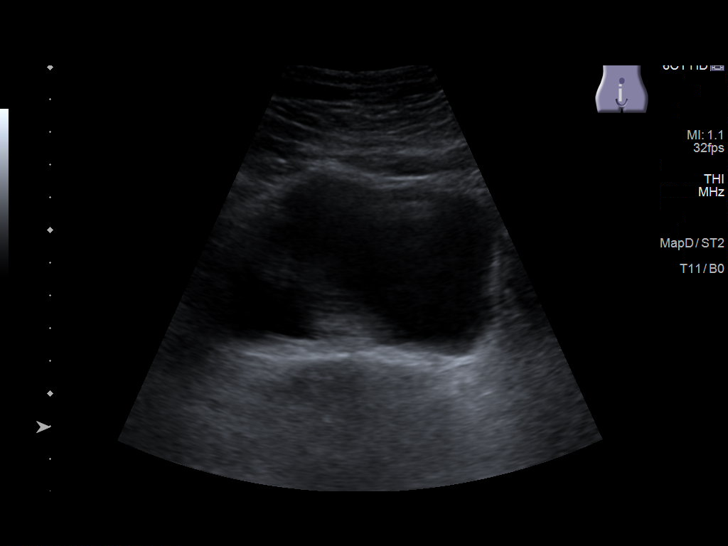

[14 of 25 positions shown; findings below may reference images not displayed]

FINDINGS: Right Kidney:

Renal measurements: 10.7 x 4.5 x 5.0 cm = volume: 124 mL.
Echogenicity within normal limits. No mass or hydronephrosis
visualized.

Left Kidney:

Renal measurements: 11.2 x 5.5 x 5.1 cm = volume: 165 mL.
Echogenicity within normal limits. No mass or hydronephrosis
visualized.

Bladder:

There is a partially visualized isoechoic lesion within the
posterior bladder, measuring 2.3 x 1.6 x 1.3 cm. There is no
internal vascularity. Limited views reported by the sonographer.

Other:

None.
IMPRESSION: Partially visualized isoechoic lesion within the posterior bladder
measuring 2.3 x 1.6 x 1.3 cm. This is favored to represent blood
clot, however a mucosal lesion can not be completely excluded.
Recommend urology referral and follow-up ultrasound in 3 months to
ensure resolution.

Normal sonographic appearance of the kidneys.

## 2023-06-29 ENCOUNTER — Other Ambulatory Visit: Payer: Self-pay | Admitting: Obstetrics and Gynecology

## 2023-06-29 ENCOUNTER — Encounter: Payer: Self-pay | Admitting: Obstetrics and Gynecology

## 2023-06-29 DIAGNOSIS — N644 Mastodynia: Secondary | ICD-10-CM

## 2023-07-14 ENCOUNTER — Other Ambulatory Visit: Payer: BC Managed Care – PPO

## 2023-07-14 ENCOUNTER — Inpatient Hospital Stay
Admission: RE | Admit: 2023-07-14 | Discharge: 2023-07-14 | Discharge status: Home / Self Care | Payer: BC Managed Care – PPO | Source: Ambulatory Visit | Attending: Obstetrics and Gynecology | Admitting: Obstetrics and Gynecology

## 2023-07-14 ENCOUNTER — Other Ambulatory Visit: Payer: Self-pay | Admitting: Obstetrics and Gynecology

## 2023-07-14 DIAGNOSIS — N644 Mastodynia: Secondary | ICD-10-CM

## 2023-08-19 ENCOUNTER — Ambulatory Visit: Payer: BC Managed Care – PPO | Admitting: Podiatry
